# Patient Record
Sex: Female | Born: 1990 | Race: Black or African American | Hispanic: Yes | Marital: Single | Smoking: Never smoker
Health system: Southern US, Community
[De-identification: ages and names within clinical notes are randomized; demographics above are authoritative.]

## PROBLEM LIST (undated history)

## (undated) DIAGNOSIS — F419 Anxiety disorder, unspecified: Secondary | ICD-10-CM

## (undated) DIAGNOSIS — F32A Depression, unspecified: Secondary | ICD-10-CM

## (undated) DIAGNOSIS — G932 Benign intracranial hypertension: Secondary | ICD-10-CM

## (undated) DIAGNOSIS — E282 Polycystic ovarian syndrome: Secondary | ICD-10-CM

## (undated) HISTORY — DX: Anxiety disorder, unspecified: F41.9

## (undated) HISTORY — DX: Benign intracranial hypertension: G93.2

## (undated) HISTORY — DX: Depression, unspecified: F32.A

## (undated) HISTORY — PX: NO PAST SURGERIES: SHX2092

---

## 2020-04-09 ENCOUNTER — Emergency Department: Payer: 59

## 2020-04-09 ENCOUNTER — Encounter: Payer: Self-pay | Admitting: Emergency Medicine

## 2020-04-09 ENCOUNTER — Other Ambulatory Visit: Payer: Self-pay

## 2020-04-09 DIAGNOSIS — R110 Nausea: Secondary | ICD-10-CM | POA: Diagnosis present

## 2020-04-09 DIAGNOSIS — R519 Headache, unspecified: Secondary | ICD-10-CM | POA: Insufficient documentation

## 2020-04-09 DIAGNOSIS — Z5321 Procedure and treatment not carried out due to patient leaving prior to being seen by health care provider: Secondary | ICD-10-CM | POA: Diagnosis not present

## 2020-04-09 DIAGNOSIS — R0602 Shortness of breath: Secondary | ICD-10-CM | POA: Insufficient documentation

## 2020-04-09 LAB — COMPREHENSIVE METABOLIC PANEL
ALT: 30 U/L (ref 0–44)
AST: 32 U/L (ref 15–41)
Albumin: 3.9 g/dL (ref 3.5–5.0)
Alkaline Phosphatase: 98 U/L (ref 38–126)
Anion gap: 9 (ref 5–15)
BUN: 9 mg/dL (ref 6–20)
CO2: 22 mmol/L (ref 22–32)
Calcium: 8.5 mg/dL — ABNORMAL LOW (ref 8.9–10.3)
Chloride: 104 mmol/L (ref 98–111)
Creatinine, Ser: 0.92 mg/dL (ref 0.44–1.00)
GFR calc Af Amer: >60 mL/min (ref >60)
GFR calc non Af Amer: >60 mL/min (ref >60)
Glucose, Bld: 96 mg/dL (ref 70–99)
Potassium: 3.8 mmol/L (ref 3.5–5.1)
Sodium: 135 mmol/L (ref 135–145)
Total Bilirubin: 0.7 mg/dL (ref 0.3–1.2)
Total Protein: 7.6 g/dL (ref 6.5–8.1)

## 2020-04-09 LAB — CBC
HCT: 27.1 % — ABNORMAL LOW (ref 36.0–46.0)
Hemoglobin: 8.2 g/dL — ABNORMAL LOW (ref 12.0–15.0)
MCH: 25 pg — ABNORMAL LOW (ref 26.0–34.0)
MCHC: 30.3 g/dL (ref 30.0–36.0)
MCV: 82.6 fL (ref 80.0–100.0)
Platelets: 174 10*3/uL (ref 150–400)
RBC: 3.28 MIL/uL — ABNORMAL LOW (ref 3.87–5.11)
RDW: 14.7 % (ref 11.5–15.5)
WBC: 7.7 10*3/uL (ref 4.0–10.5)
nRBC: 0 % (ref 0.0–0.2)

## 2020-04-09 LAB — TROPONIN I (HIGH SENSITIVITY): Troponin I (High Sensitivity): <2 ng/L (ref <18)

## 2020-04-09 IMAGING — CR DG CHEST 2V
1 series · 2 of 2 positions shown · non-contrast
Comparison: None.

CLINICAL DATA: Shortness of breath

EXAM:
CHEST - 2 VIEW

[Series 1: dg chest 2 view · 0.14mm/px · 2 of 2 slices shown]
[im 1/2]
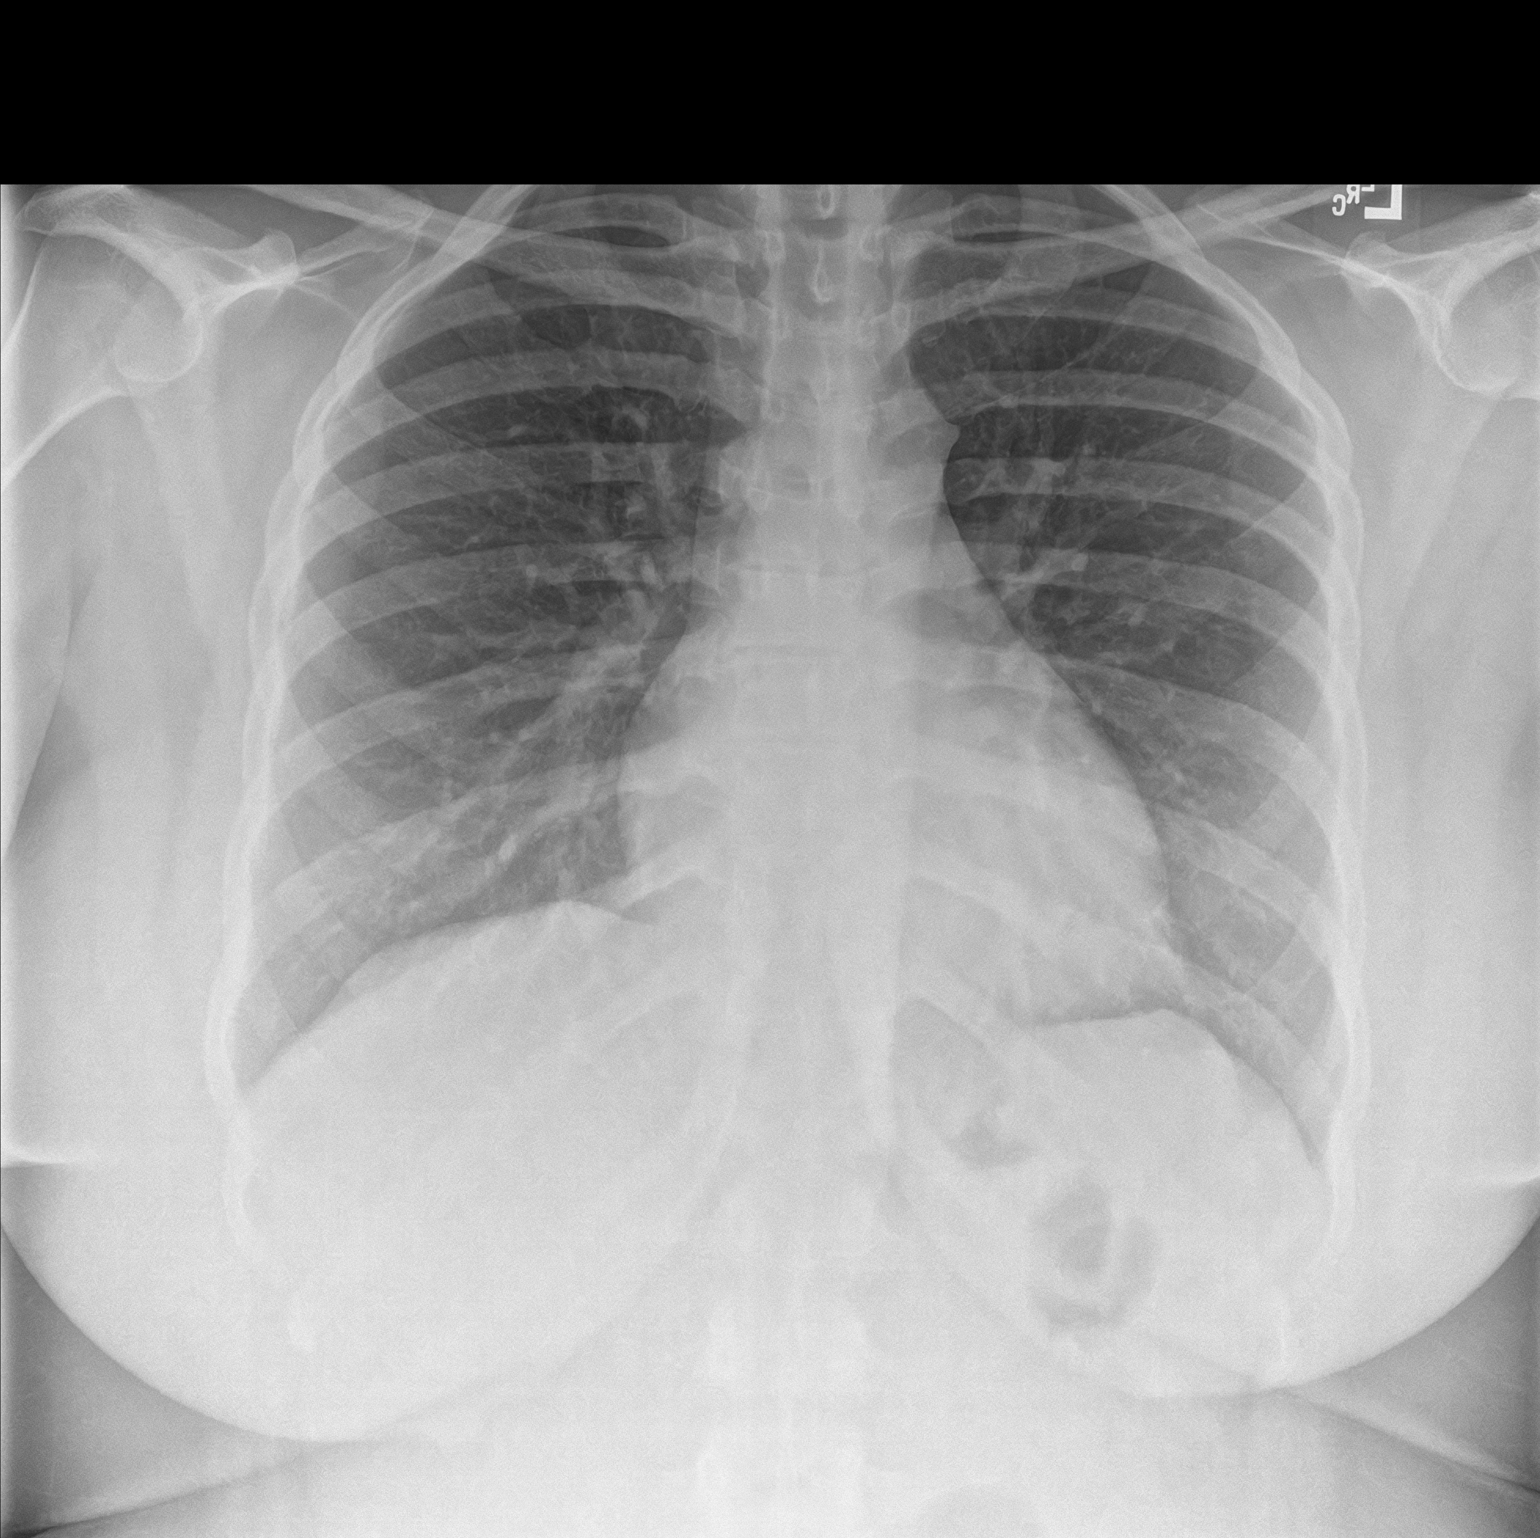
[im 2/2]
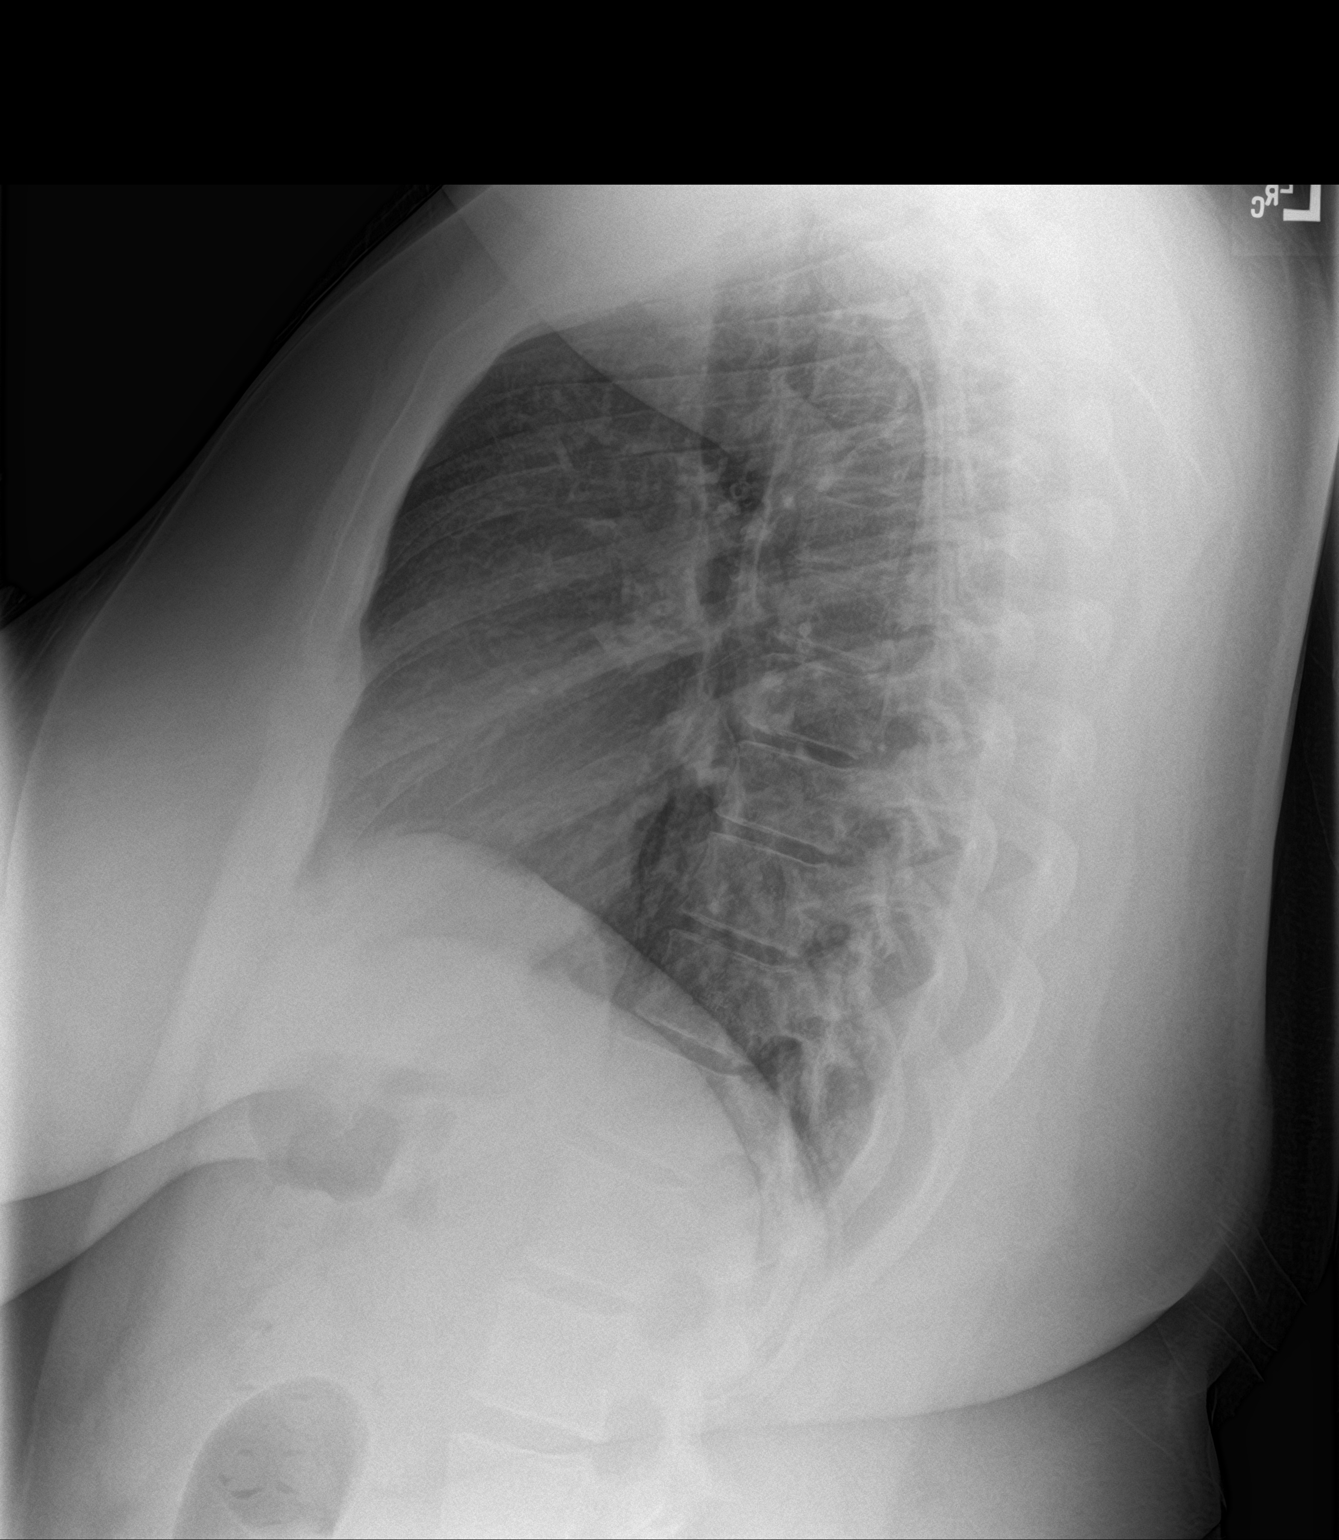

[2 of 2 positions shown; findings below may reference images not displayed]

FINDINGS: The heart size and mediastinal contours are within normal limits.
Both lungs are clear. The visualized skeletal structures are
unremarkable.
IMPRESSION: No active cardiopulmonary disease.

## 2020-04-09 NOTE — ED Triage Notes (Signed)
Patient with complaint of nausea, headaches and shortness of breath times one week.

## 2020-04-10 ENCOUNTER — Emergency Department
Admission: EM | Admit: 2020-04-10 | Discharge: 2020-04-10 | Disposition: A | Discharge status: Home / Self Care | Payer: 59 | Attending: Emergency Medicine | Admitting: Emergency Medicine

## 2020-04-10 LAB — TROPONIN I (HIGH SENSITIVITY): Troponin I (High Sensitivity): <2 ng/L (ref <18)

## 2020-04-11 ENCOUNTER — Telehealth: Payer: Self-pay | Admitting: Emergency Medicine

## 2020-04-11 NOTE — Telephone Encounter (Signed)
Called patient due to lwot to inquire about condition and follow up plans. I explained the hgb and hct values.  She says she does remember being anemic as a child.  I told her to call the Gyn she saw earlier this month and alert them that the results are in epic.  I told her for non gyn issues she can go to [facility] or [facility] urgent [facility] or return here.

## 2020-04-12 ENCOUNTER — Encounter: Payer: Self-pay | Admitting: Obstetrics & Gynecology

## 2020-10-11 ENCOUNTER — Other Ambulatory Visit: Payer: Self-pay

## 2020-10-11 DIAGNOSIS — M25512 Pain in left shoulder: Secondary | ICD-10-CM | POA: Diagnosis present

## 2020-10-11 DIAGNOSIS — R079 Chest pain, unspecified: Secondary | ICD-10-CM | POA: Diagnosis not present

## 2020-10-11 DIAGNOSIS — R002 Palpitations: Secondary | ICD-10-CM | POA: Diagnosis not present

## 2020-10-11 NOTE — ED Triage Notes (Signed)
Pt in with co palpitations for a few days and left upper arm pain. States also feels like there is an area to left upper arm that feels swollen. No cardiac history or history of the same.

## 2020-10-12 ENCOUNTER — Emergency Department
Admission: EM | Admit: 2020-10-12 | Discharge: 2020-10-12 | Disposition: A | Discharge status: Home / Self Care | Payer: 59 | Attending: Emergency Medicine | Admitting: Emergency Medicine

## 2020-10-12 DIAGNOSIS — R002 Palpitations: Secondary | ICD-10-CM

## 2020-10-12 DIAGNOSIS — M25512 Pain in left shoulder: Secondary | ICD-10-CM

## 2020-10-12 LAB — CBC
HCT: 34.1 % — ABNORMAL LOW (ref 36.0–46.0)
Hemoglobin: 10.2 g/dL — ABNORMAL LOW (ref 12.0–15.0)
MCH: 22.2 pg — ABNORMAL LOW (ref 26.0–34.0)
MCHC: 29.9 g/dL — ABNORMAL LOW (ref 30.0–36.0)
MCV: 74.3 fL — ABNORMAL LOW (ref 80.0–100.0)
Platelets: 179 10*3/uL (ref 150–400)
RBC: 4.59 MIL/uL (ref 3.87–5.11)
RDW: 18.7 % — ABNORMAL HIGH (ref 11.5–15.5)
WBC: 6.6 10*3/uL (ref 4.0–10.5)
nRBC: 0 % (ref 0.0–0.2)

## 2020-10-12 LAB — COMPREHENSIVE METABOLIC PANEL
ALT: 27 U/L (ref 0–44)
AST: 26 U/L (ref 15–41)
Albumin: 3.9 g/dL (ref 3.5–5.0)
Alkaline Phosphatase: 102 U/L (ref 38–126)
Anion gap: 10 (ref 5–15)
BUN: 13 mg/dL (ref 6–20)
CO2: 24 mmol/L (ref 22–32)
Calcium: 8.7 mg/dL — ABNORMAL LOW (ref 8.9–10.3)
Chloride: 102 mmol/L (ref 98–111)
Creatinine, Ser: 0.8 mg/dL (ref 0.44–1.00)
GFR, Estimated: >60 mL/min (ref >60)
Glucose, Bld: 106 mg/dL — ABNORMAL HIGH (ref 70–99)
Potassium: 3.7 mmol/L (ref 3.5–5.1)
Sodium: 136 mmol/L (ref 135–145)
Total Bilirubin: 0.7 mg/dL (ref 0.3–1.2)
Total Protein: 7.8 g/dL (ref 6.5–8.1)

## 2020-10-12 LAB — TROPONIN I (HIGH SENSITIVITY): Troponin I (High Sensitivity): <2 ng/L (ref <18)

## 2020-10-12 NOTE — Discharge Instructions (Addendum)
Your cardiac work-up today was very reassuring.  No sign of heart attack.  Symptoms may be related to anxiety.  Steps to find a Primary Care Provider (PCP):  Call [phone] or [phone] to access "[facility] Find a Doctor Service."  2.  You may also go on the [facility] website at [email or web address]

## 2020-10-12 NOTE — ED Provider Notes (Signed)
[facility] Emergency Department Provider Note  ____________________________________________   Event Date/Time   First MD Initiated Contact with Patient 10/12/20 0133     (approximate)  I have reviewed the triage vital signs and the nursing notes.   HISTORY  Chief Complaint Chest Pain    HPI Anna Douglas is a 30 y.o. female with history of anxiety who presents to the emergency department with left shoulder achiness for the past week.  Also has had intermittent palpitations.  No chest pain, shortness of breath, nausea, vomiting, diaphoresis, dizziness, fever, cough.  No history of PE, DVT, exogenous estrogen use, recent fractures, surgery, trauma, hospitalization, prolonged travel or other immobilization. No lower extremity swelling or pain. No calf tenderness.  States she was telling her grandmother about her symptoms tonight and she told her she needs to come to the emergency department for evaluation.  No injury to the arm.        No past medical history on file.  There are no problems to display for this patient.   No past surgical history on file.  Prior to Admission medications   Not on File    Allergies Patient has no known allergies.  No family history on file.  Social History Social History   Tobacco Use  . Smoking status: Never Smoker  . Smokeless tobacco: Never Used  Substance Use Topics  . Alcohol use: Yes    Comment: occ  . Drug use: Never    Review of Systems Constitutional: No fever. Eyes: No visual changes. ENT: No sore throat. Cardiovascular: Denies chest pain. Respiratory: Denies shortness of breath. Gastrointestinal: No nausea, vomiting, diarrhea. Genitourinary: Negative for dysuria. Musculoskeletal: Negative for back pain. Skin: Negative for rash. Neurological: Negative for focal weakness or numbness.  ____________________________________________   PHYSICAL EXAM:  VITAL SIGNS: ED Triage Vitals   Enc Vitals Group     BP 10/12/20 0242 115/64     Pulse Rate 10/11/20 2331 89     Resp 10/11/20 2331 20     Temp 10/11/20 2331 98.4 F (36.9 C)     Temp Source 10/11/20 2331 Oral     SpO2 10/11/20 2331 100 %     Weight 10/11/20 2331 270 lb (122.5 kg)     Height 10/11/20 2331 5\' 10"  (1.778 m)     Head Circumference --      Peak Flow --      Pain Score 10/11/20 2331 5     Pain Loc --      Pain Edu? --      Excl. in GC? --    CONSTITUTIONAL: Alert and oriented and responds appropriately to questions. Well-appearing; well-nourished HEAD: Normocephalic EYES: Conjunctivae clear, pupils appear equal, EOM appear intact ENT: normal nose; moist mucous membranes NECK: Supple, normal ROM CARD: RRR; S1 and S2 appreciated; no murmurs, no clicks, no rubs, no gallops RESP: Normal chest excursion without splinting or tachypnea; breath sounds clear and equal bilaterally; no wheezes, no rhonchi, no rales, no hypoxia or respiratory distress, speaking full sentences ABD/GI: Normal bowel sounds; non-distended; soft, non-tender, no rebound, no guarding, no peritoneal signs, no hepatosplenomegaly BACK: The back appears normal EXT: Normal ROM in all joints; no deformity noted, no edema; no cyanosis, no swelling to the left upper extremity, no joint effusions, no bony deformity, 2+ left radial pulse, normal sensation throughout the left arm, no redness or warmth in the left upper extremity, compartments in the left arm are soft, no calf tenderness  or calf swelling SKIN: Normal color for age and race; warm; no rash on exposed skin NEURO: Moves all extremities equally PSYCH: The patient's mood and manner are appropriate.  ____________________________________________   LABS (all labs ordered are listed, but only abnormal results are displayed)  Labs Reviewed  CBC - Abnormal; Notable for the following components:      Result Value   Hemoglobin 10.2 (*)    HCT 34.1 (*)    MCV 74.3 (*)    MCH 22.2 (*)     MCHC 29.9 (*)    RDW 18.7 (*)    All other components within normal limits  COMPREHENSIVE METABOLIC PANEL - Abnormal; Notable for the following components:   Glucose, Bld 106 (*)    Calcium 8.7 (*)    All other components within normal limits  POC URINE PREG, ED  TROPONIN I (HIGH SENSITIVITY)   ____________________________________________  EKG   EKG Interpretation  Date/Time:  Wednesday October 11 2020 23:28:08 EST Ventricular Rate:  93 PR Interval:  144 QRS Duration: 94 QT Interval:  352 QTC Calculation: 437 R Axis:   68 Text Interpretation: Normal sinus rhythm with sinus arrhythmia Incomplete right bundle branch block Borderline ECG No significant change since last tracing Confirmed by Rochele Raring [phone]) on 10/12/2020 2:09:04 AM       ____________________________________________  RADIOLOGY Normajean Baxter Lucyle Alumbaugh, personally viewed and evaluated these images (plain radiographs) as part of my medical decision making, as well as reviewing the written report by the radiologist.  ED MD interpretation:  none  Official radiology report(s): No results found.  ____________________________________________   PROCEDURES  Procedure(s) performed (including Critical Care):  none  ____________________________________________   INITIAL IMPRESSION / ASSESSMENT AND PLAN / ED COURSE  As part of my medical decision making, I reviewed the following data within the electronic MEDICAL RECORD NUMBER Nursing notes reviewed and incorporated, Labs reviewed, EKG interpreted NSR and Notes from prior ED visits         Patient here with left shoulder pain.  She was concerned that this could be cardiac in nature.  No risk factors for ACS other than obesity.  She is PERC negative.  She denies any chest pain or shortness of breath.  No infectious symptoms.  Labs here are reassuring.  Troponin negative and this is after 1 week of symptoms.  EKG nonischemic.  I feel she is safe for discharge.  She  thinks that her symptoms may be related to anxiety.  Will give outpatient PCP follow-up.  Doubt ACS, PE, dissection today.  Doubt pneumonia.  No signs of volume overload.   At this time, I do not feel there is any life-threatening condition present. I have reviewed, interpreted and discussed all results (EKG, imaging, lab, urine as appropriate) and exam findings with patient/family. I have reviewed nursing notes and appropriate previous records.  I feel the patient is safe to be discharged home without further emergent workup and can continue workup as an outpatient as needed. Discussed usual and customary return precautions. Patient/family verbalize understanding and are comfortable with this plan.  Outpatient follow-up has been provided as needed. All questions have been answered.       ____________________________________________   FINAL CLINICAL IMPRESSION(S) / ED DIAGNOSES  Final diagnoses:  Acute pain of left shoulder  Palpitations     ED Discharge Orders    None      *Please note:  Dameka Alyss Granato was evaluated in Emergency Department on 10/12/2020 for the symptoms described  in the history of present illness. She was evaluated in the context of the global COVID-19 pandemic, which necessitated consideration that the patient might be at risk for infection with the SARS-CoV-2 virus that causes COVID-19. Institutional protocols and algorithms that pertain to the evaluation of patients at risk for COVID-19 are in a state of rapid change based on information released by regulatory bodies including the CDC and federal and state organizations. These policies and algorithms were followed during the patient's care in the ED.  Some ED evaluations and interventions may be delayed as a result of limited staffing during and the pandemic.*   Note:  This document was prepared using Dragon voice recognition software and may include unintentional dictation errors.   Makynlee Kressin, Layla Maw,  DO 10/12/20 [phone]

## 2021-02-28 ENCOUNTER — Other Ambulatory Visit: Payer: Self-pay

## 2021-02-28 ENCOUNTER — Emergency Department
Admission: EM | Admit: 2021-02-28 | Discharge: 2021-02-28 | Disposition: A | Discharge status: Home / Self Care | Payer: 59 | Attending: Emergency Medicine | Admitting: Emergency Medicine

## 2021-02-28 ENCOUNTER — Emergency Department: Payer: 59

## 2021-02-28 ENCOUNTER — Encounter: Payer: Self-pay | Admitting: Emergency Medicine

## 2021-02-28 DIAGNOSIS — R519 Headache, unspecified: Secondary | ICD-10-CM | POA: Diagnosis present

## 2021-02-28 DIAGNOSIS — G932 Benign intracranial hypertension: Secondary | ICD-10-CM | POA: Diagnosis not present

## 2021-02-28 HISTORY — DX: Polycystic ovarian syndrome: E28.2

## 2021-02-28 LAB — CBC WITH DIFFERENTIAL/PLATELET
Abs Immature Granulocytes: 0.01 10*3/uL (ref 0.00–0.07)
Basophils Absolute: 0 10*3/uL (ref 0.0–0.1)
Basophils Relative: 0 %
Eosinophils Absolute: 0 10*3/uL (ref 0.0–0.5)
Eosinophils Relative: 0 %
HCT: 36.3 % (ref 36.0–46.0)
Hemoglobin: 11.3 g/dL — ABNORMAL LOW (ref 12.0–15.0)
Immature Granulocytes: 0 %
Lymphocytes Relative: 30 %
Lymphs Abs: 1.6 10*3/uL (ref 0.7–4.0)
MCH: 24.4 pg — ABNORMAL LOW (ref 26.0–34.0)
MCHC: 31.1 g/dL (ref 30.0–36.0)
MCV: 78.4 fL — ABNORMAL LOW (ref 80.0–100.0)
Monocytes Absolute: 0.2 10*3/uL (ref 0.1–1.0)
Monocytes Relative: 5 %
Neutro Abs: 3.5 10*3/uL (ref 1.7–7.7)
Neutrophils Relative %: 65 %
Platelets: 176 10*3/uL (ref 150–400)
RBC: 4.63 MIL/uL (ref 3.87–5.11)
RDW: 15.7 % — ABNORMAL HIGH (ref 11.5–15.5)
WBC: 5.4 10*3/uL (ref 4.0–10.5)
nRBC: 0 % (ref 0.0–0.2)

## 2021-02-28 LAB — BASIC METABOLIC PANEL
Anion gap: 7 (ref 5–15)
BUN: 12 mg/dL (ref 6–20)
CO2: 24 mmol/L (ref 22–32)
Calcium: 9.2 mg/dL (ref 8.9–10.3)
Chloride: 104 mmol/L (ref 98–111)
Creatinine, Ser: 0.8 mg/dL (ref 0.44–1.00)
GFR, Estimated: >60 mL/min (ref >60)
Glucose, Bld: 90 mg/dL (ref 70–99)
Potassium: 3.9 mmol/L (ref 3.5–5.1)
Sodium: 135 mmol/L (ref 135–145)

## 2021-02-28 LAB — HCG, QUANTITATIVE, PREGNANCY: hCG, Beta Chain, Quant, S: <1 m[IU]/mL (ref <5)

## 2021-02-28 IMAGING — MR MR MRV HEAD WO/W CM
2 of 5 series · 36 of 48 positions shown · IV contrast (10ml Gadavist)
Comparison: No pertinent prior exam.

CLINICAL DATA: Vision loss.

EXAM:
MR VENOGRAM HEAD WITHOUT AND WITH CONTRAST
TECHNIQUE: Angiographic images of the intracranial venous structures were
acquired using MRV technique without and with intravenous contrast.
CONTRAST:  10mL GADAVIST GADOBUTROL 1 MMOL/ML IV SOLN

[Series 6: TOF · coronal · 2.5mm · 0.98mm/px · 14 of 128 slices shown]
[im 1/128]
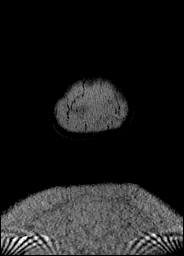
[im 10/128]
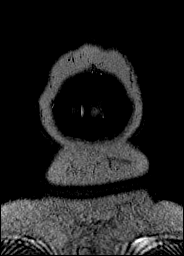
[im 20/128]
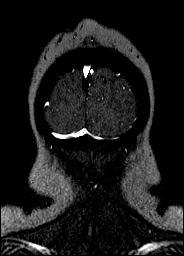
[im 30/128]
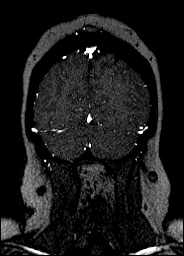
[im 40/128]
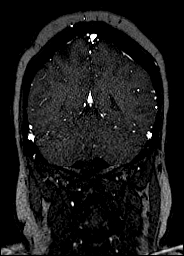
[im 49/128]
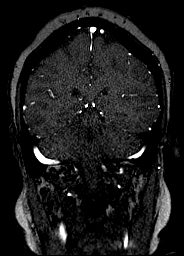
[im 59/128]
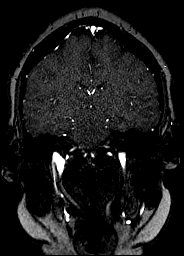
[im 69/128]
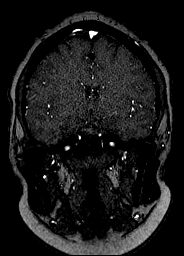
[im 79/128]
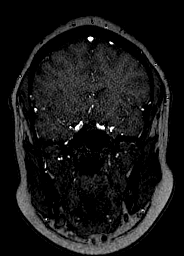
[im 88/128]
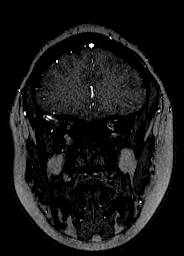
[im 98/128]
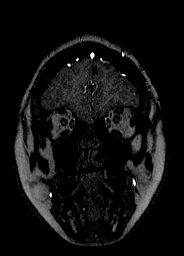
[im 108/128]
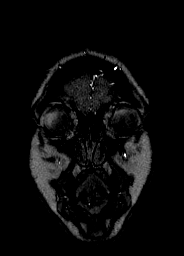
[im 118/128]
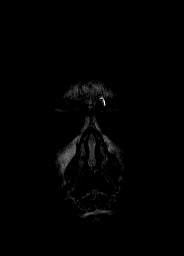
[im 128/128]
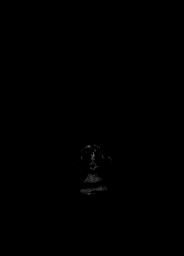

[Series 13: T1 post-contrast · sagittal · 1.0mm · 0.98mm/px · 22 of 192 slices shown]
[im 1/192]
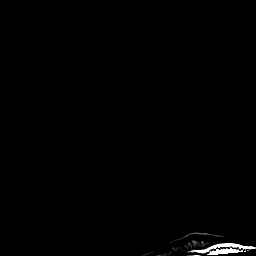
[im 10/192]
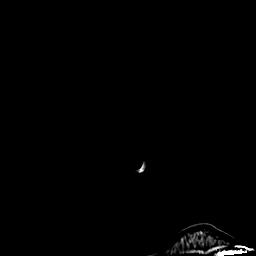
[im 19/192]
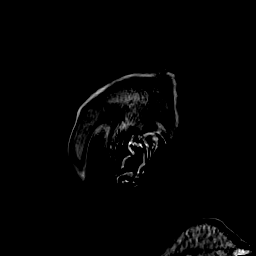
[im 28/192]
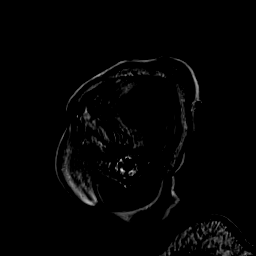
[im 37/192]
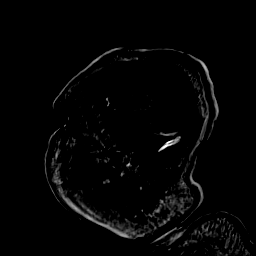
[im 46/192]
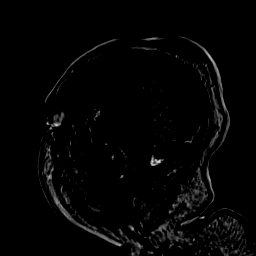
[im 55/192]
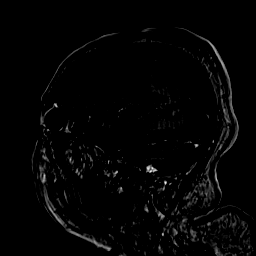
[im 64/192]
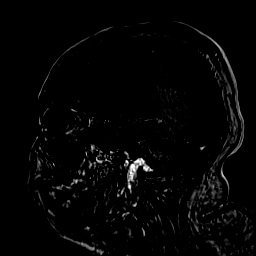
[im 73/192]
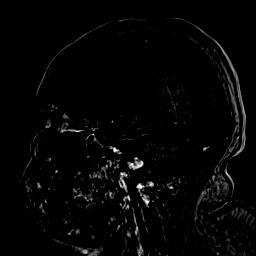
[im 82/192]
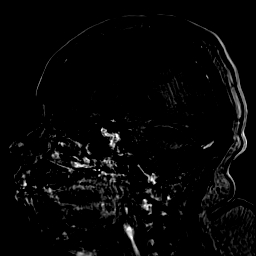
[im 91/192]
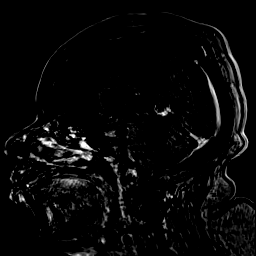
[im 101/192]
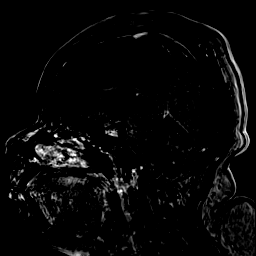
[im 110/192]
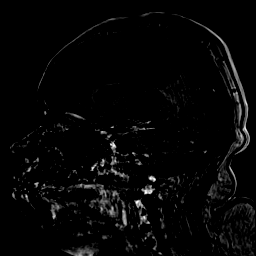
[im 119/192]
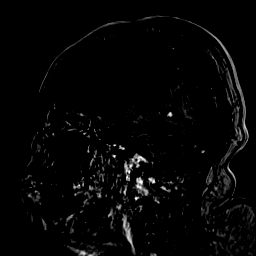
[im 128/192]
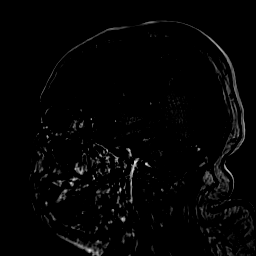
[im 137/192]
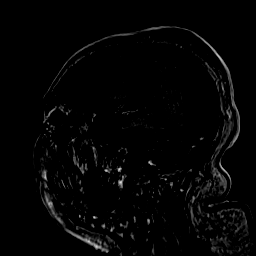
[im 146/192]
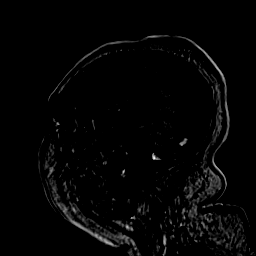
[im 155/192]
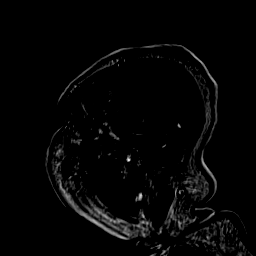
[im 164/192]
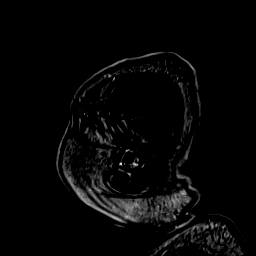
[im 173/192]
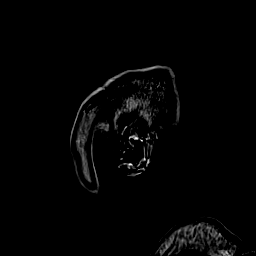
[im 182/192]
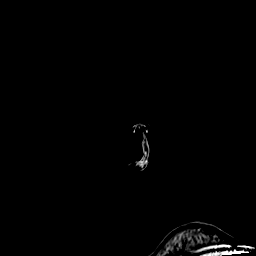
[im 192/192]
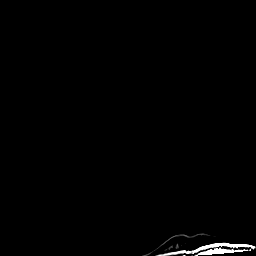

[36 of 48 positions shown; findings below may reference images not displayed]

FINDINGS: Postcontrast imaging is limited by motion. No evidence of dural
venous sinus thrombosis on the time-of-flight MRV. Small left
transverse sinus. Narrowing of the distal right transverse sinus.
Small rounded filling defects in the superior sagittal sinus and
distal right transverse sinus are favored to represent arachnoid
granulations. Linear filling apparent filling defect at the right
jugular bulb is favored to represent flow artifact.
IMPRESSION: 1. No evidence of dural venous sinus thrombosis with postcontrast
imaging limited by motion.
2. Small left transverse sinus and narrowing of the distal right
transverse sinus, which is nonspecific but can be seen with
idiopathic intracranial hypertension (particularly given findings on
same day CT head and MRI orbits).

## 2021-02-28 IMAGING — MR MR ORBITS WO/W CM
4 of 6 series · 25 of 48 positions shown · IV contrast (10ml Gadavist)
Comparison: Same day CT head.

CLINICAL DATA: Vision changes

EXAM:
MRI OF THE ORBITS WITHOUT AND WITH CONTRAST
TECHNIQUE: Multiplanar, multi-echo pulse sequences of the orbits and
surrounding structures were acquired including fat saturation
techniques, before and after intravenous contrast administration.
CONTRAST:  10mL GADAVIST GADOBUTROL 1 MMOL/ML IV SOLN

[Series 5: T1 · sagittal · 3.0mm · 0.38mm/px · 8 of 33 slices shown (1 of 2)]
[im 1/33]
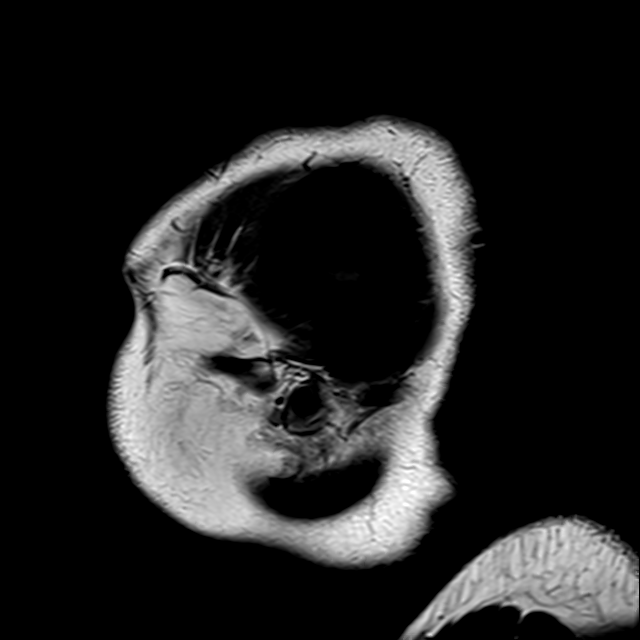
[im 4/33]
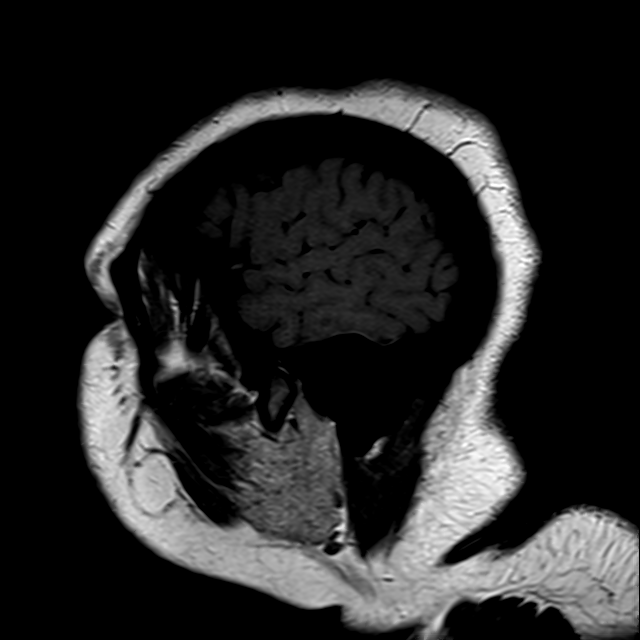
[im 11/33]
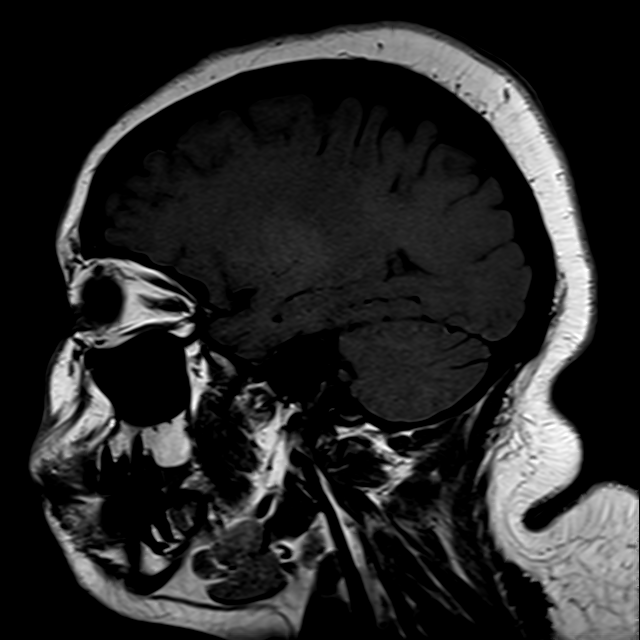
[im 15/33]
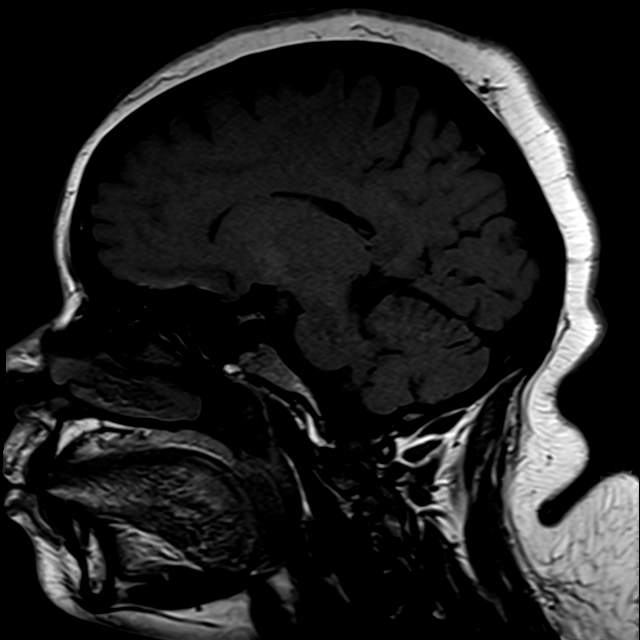
[im 18/33]
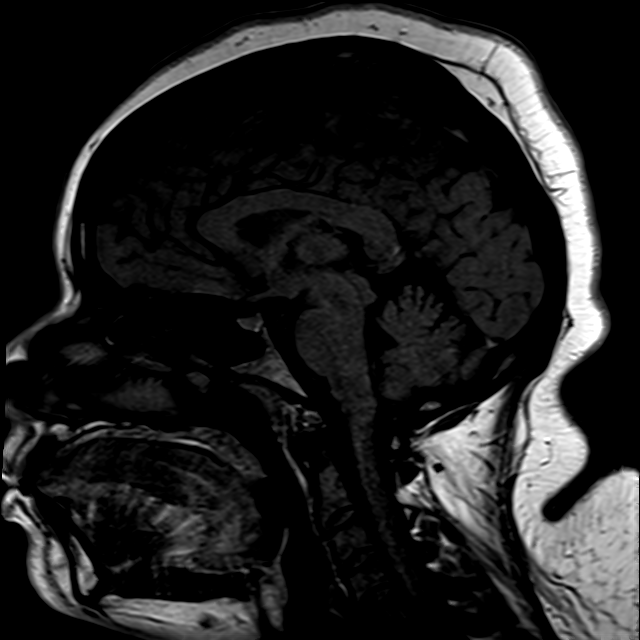
[im 22/33]
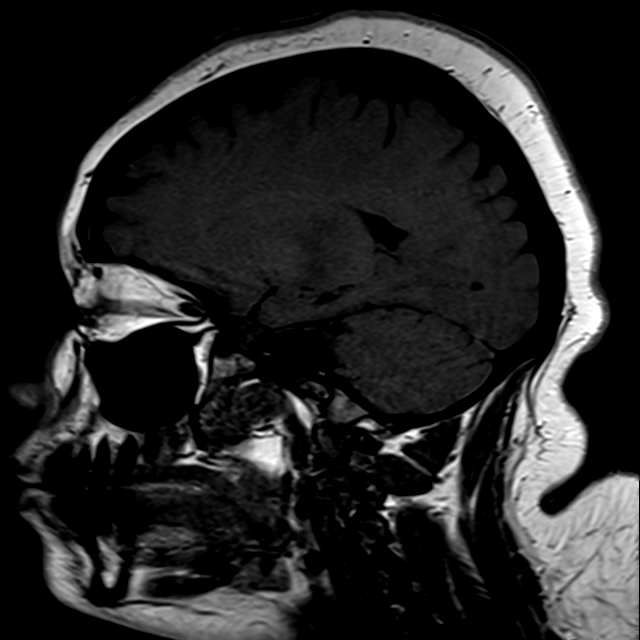
[im 29/33]
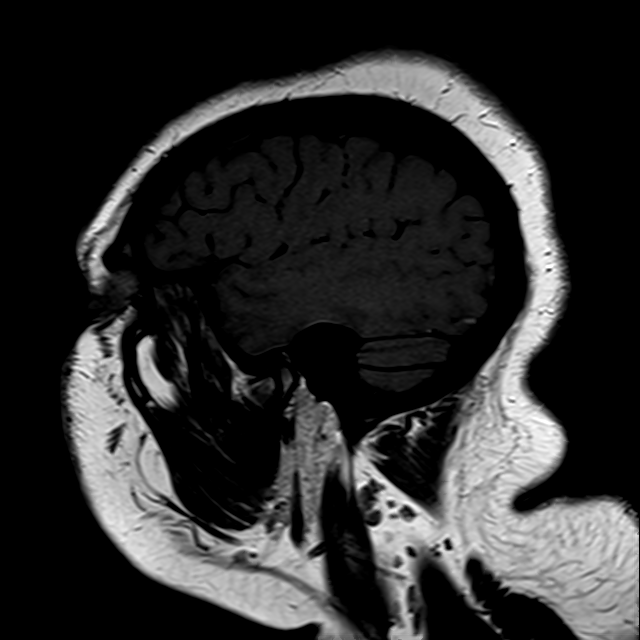
[im 33/33]
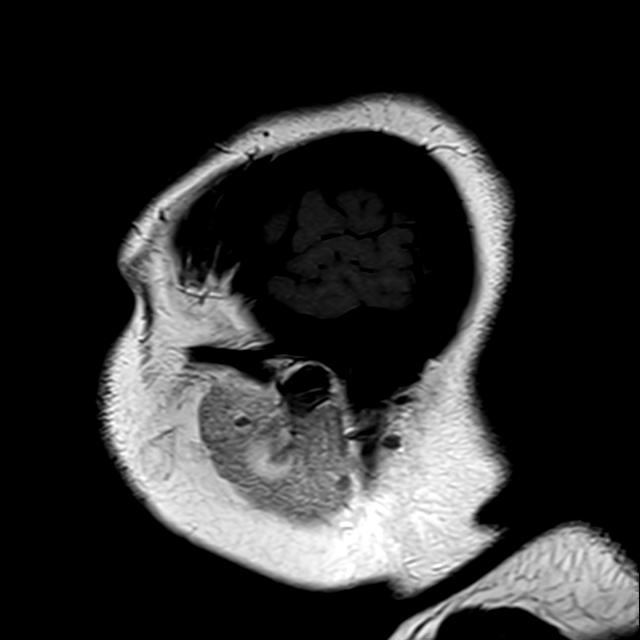

[Series 7: T2 · axial · 3.0mm · 0.78mm/px · z∈[-54,+10]mm · 5 of 17 slices shown (1 of 2)]
[im 1/17]
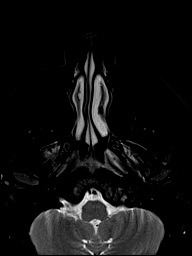
[im 5/17]
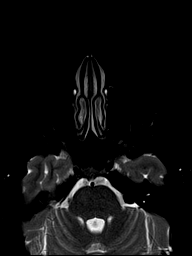
[im 9/17]
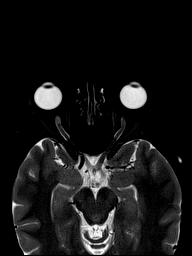
[im 13/17]
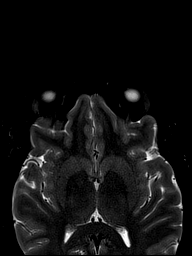
[im 17/17]
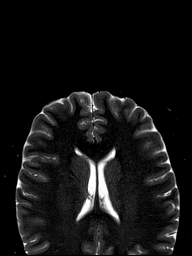

[Series 9: T2 · coronal · 3.0mm · 0.78mm/px · 8 of 35 slices shown (2 of 2)]
[im 1/35]
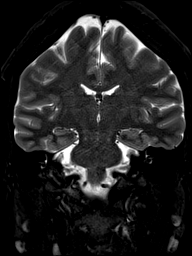
[im 4/35]
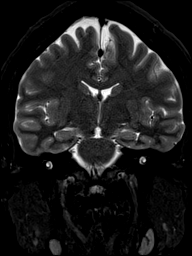
[im 12/35]
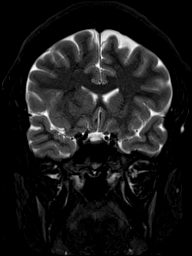
[im 16/35]
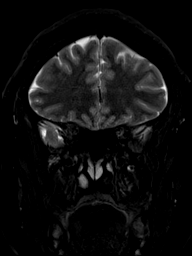
[im 19/35]
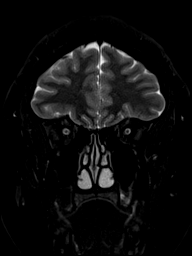
[im 23/35]
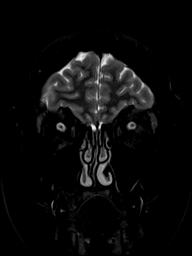
[im 31/35]
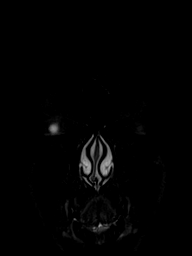
[im 35/35]
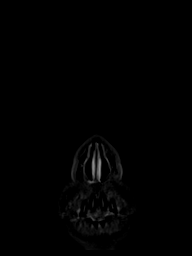

[Series 10: T1 · axial · non-contrast · 3.0mm · 0.31mm/px · z∈[-57,+9]mm · 4 of 23 slices shown (2 of 2)]
[im 1/23]
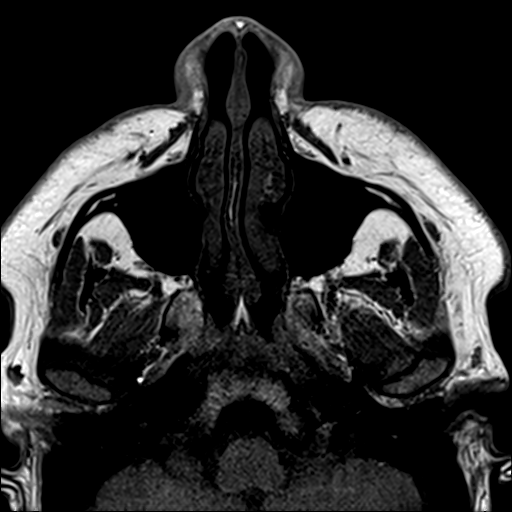
[im 5/23]
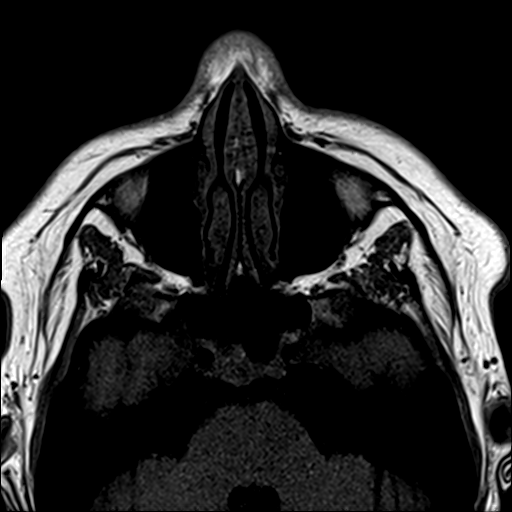
[im 14/23]
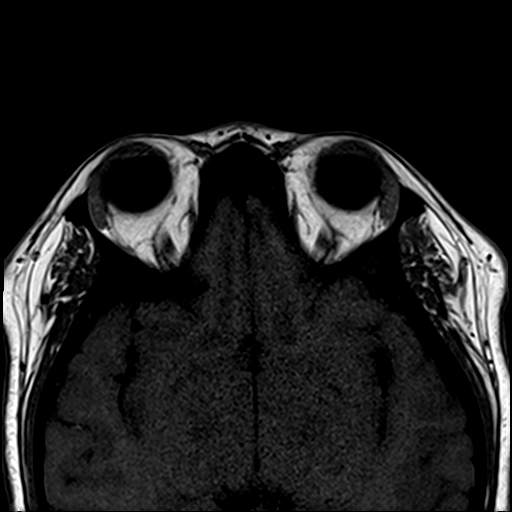
[im 23/23]
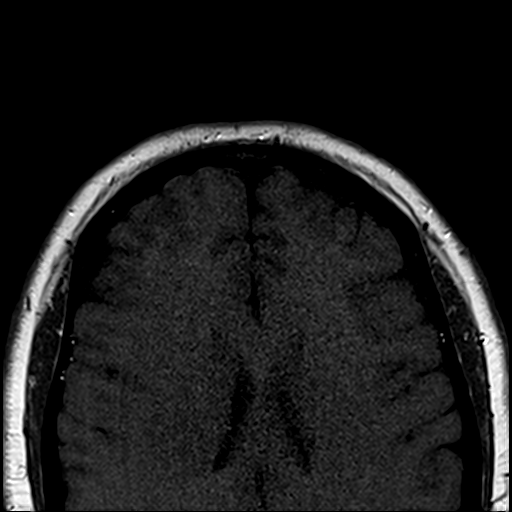

[25 of 48 positions shown; findings below may reference images not displayed]

FINDINGS: Orbits: Mildly tortuosity of the optic nerves with mild prominence
of the optic nerve sheaths bilaterally. No clear flattening of the
posterior sclera or protrusion of the optic nerve heads to suggest
papilledema. No orbital mass or evidence of inflammation. Normal
appearance of the globes, extraocular muscles, orbital fat and
lacrimal glands. Motion limited postcontrast imaging without obvious
evidence of abnormal enhancement.

Visualized sinuses: Clear sinuses.

Soft tissues: Normal.

Limited intracranial: No acute or significant finding.
IMPRESSION: Mildly tortuosity of the optic nerves bilaterally with mild
prominence of the optic nerve sheaths, which is nonspecific but can
be seen with increased intracranial pressure (including idiopathic
intracranial hypertension). No clear flattening of the posterior
sclera or protrusion of the optic nerve heads to suggest papilledema
although funduscopic exam could better evaluate if clinically
indicated.

## 2021-02-28 IMAGING — CT CT HEAD W/O CM
3 series · 15 of 47 positions shown, 18 images · non-contrast
Comparison: None.

CLINICAL DATA: 29-year-old female with frontal headache for 2
months.

EXAM:
CT HEAD WITHOUT CONTRAST
TECHNIQUE: Contiguous axial images were obtained from the base of the skull
through the vertex without intravenous contrast.

[Series 2: head wo · axial · 0.44mm/px · z∈[-138,-3]mm · 9 of 33 slices shown, 12 images]
[im 3/33  brain]
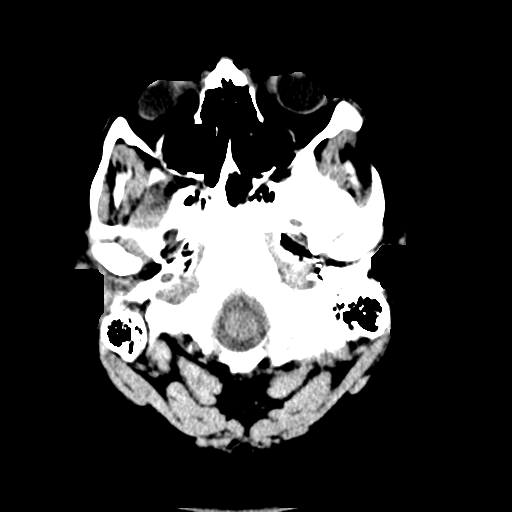
[im 3/33  bone]
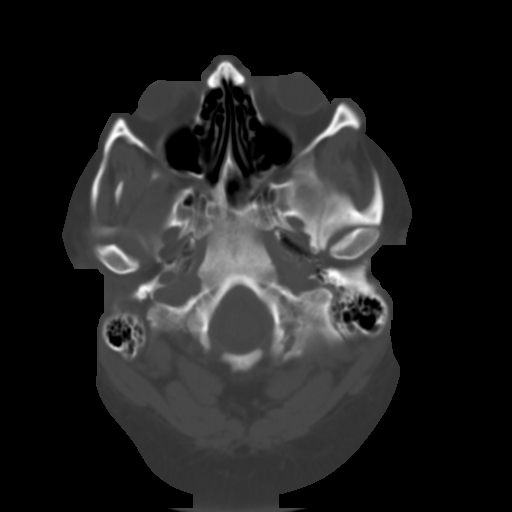
[im 6/33  brain]
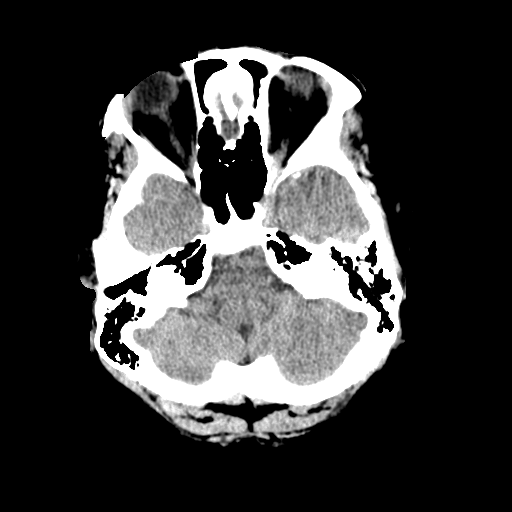
[im 9/33  brain]
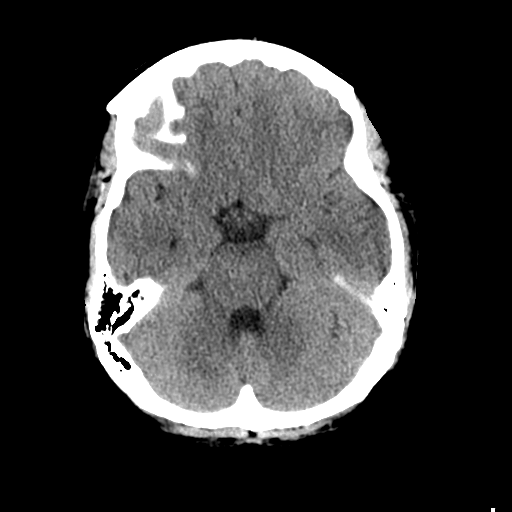
[im 13/33  brain]
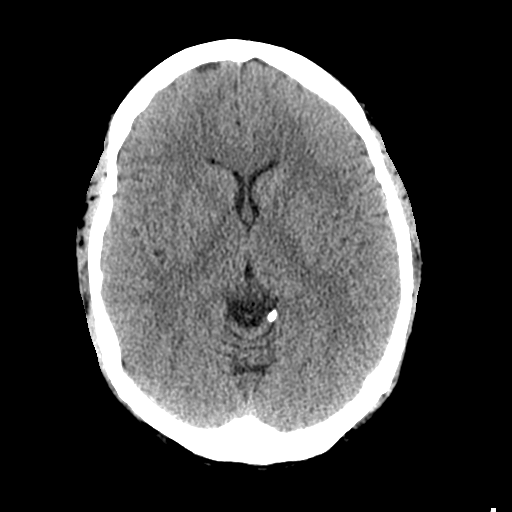
[im 17/33  brain]
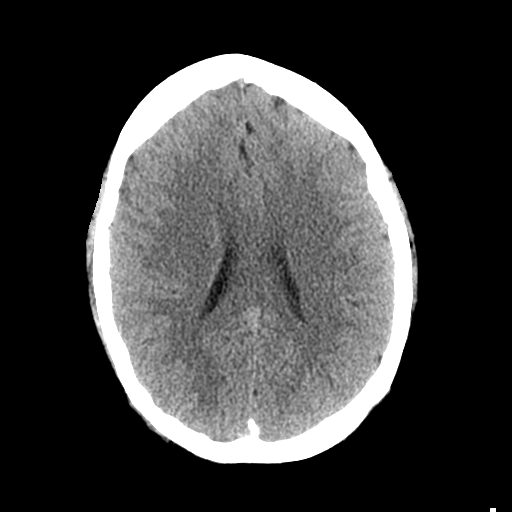
[im 17/33  bone]
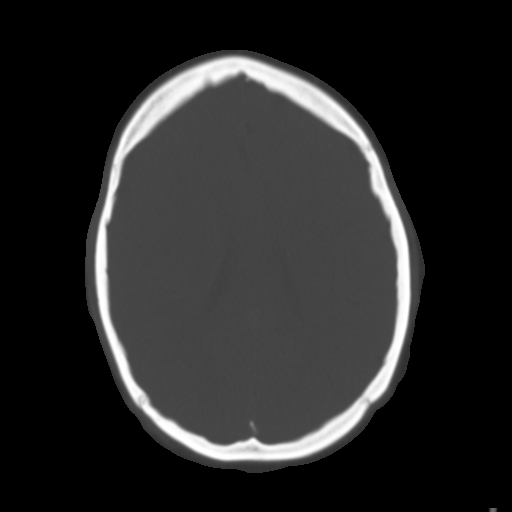
[im 20/33  brain]
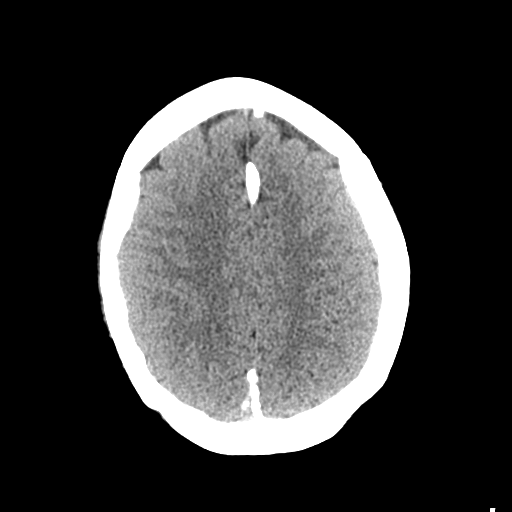
[im 24/33  brain]
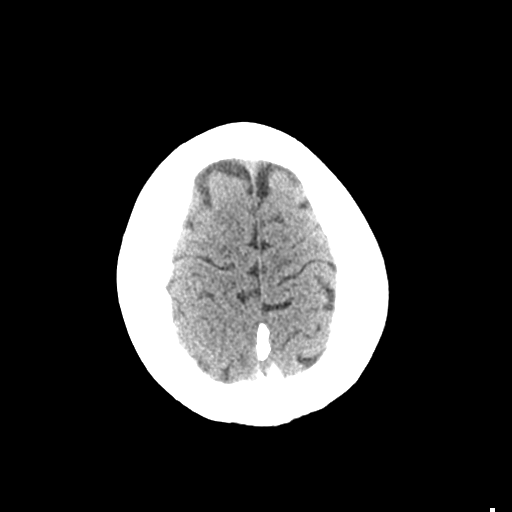
[im 27/33  brain]
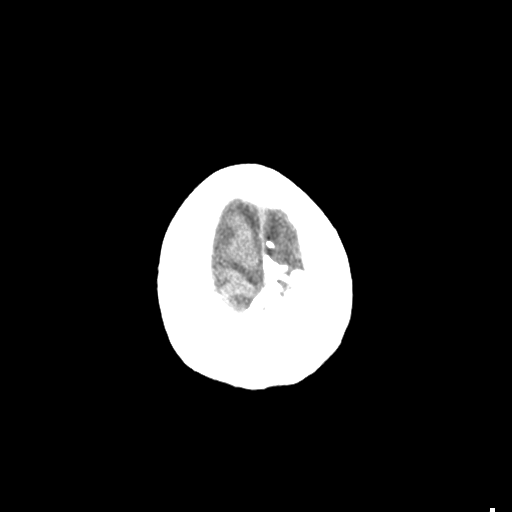
[im 30/33  brain]
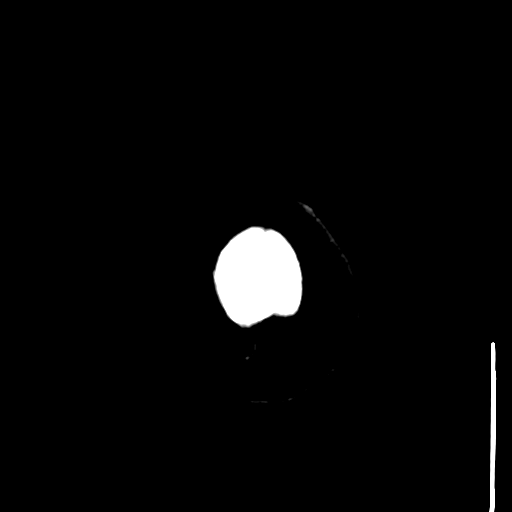
[im 30/33  bone]
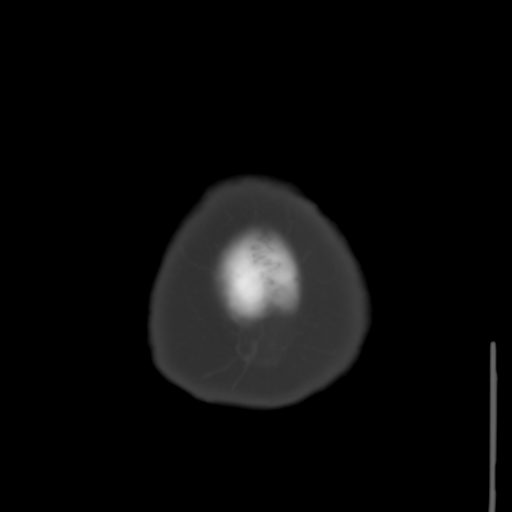

[Series 4: coronal soft tissue · coronal · 0.34mm/px · 3 of 69 slices shown]
[im 23/69  brain]
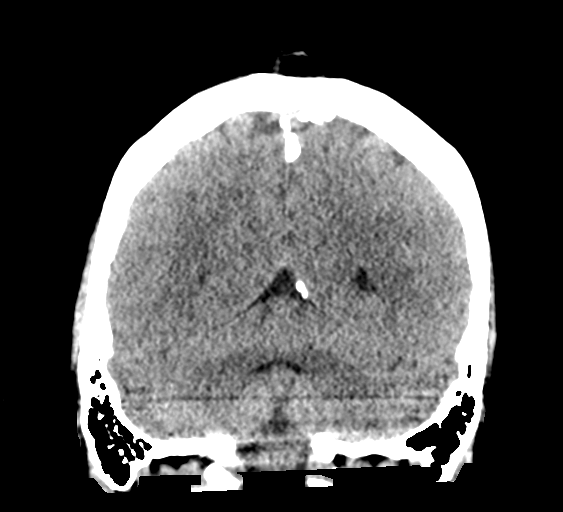
[im 31/69  brain]
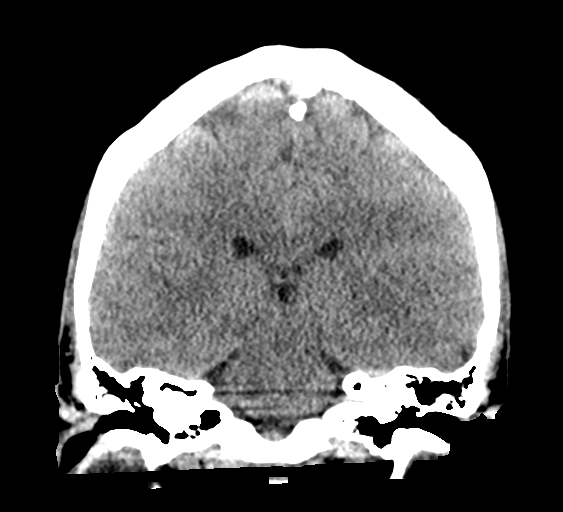
[im 38/69  brain]
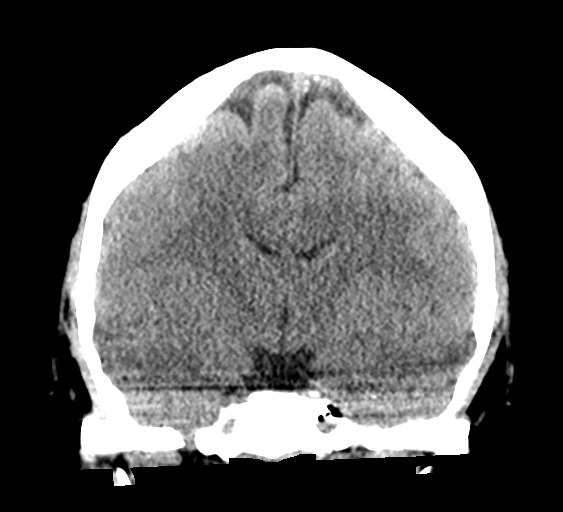

[Series 5: sagittal soft tissue · sagittal · 0.32mm/px · 3 of 56 slices shown]
[im 19/56  brain]
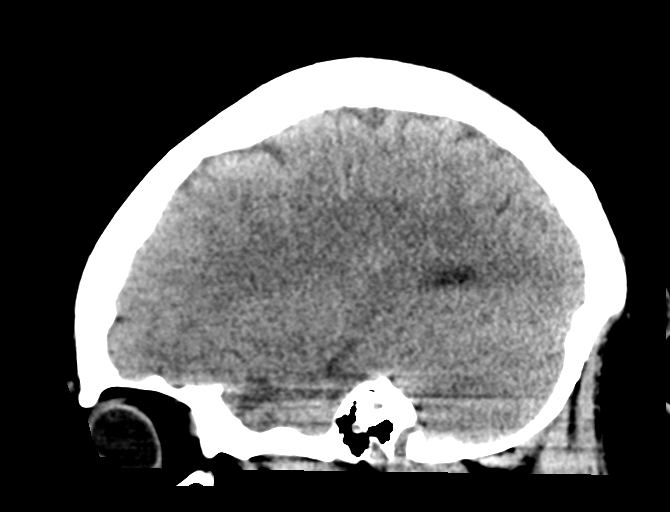
[im 28/56  brain]
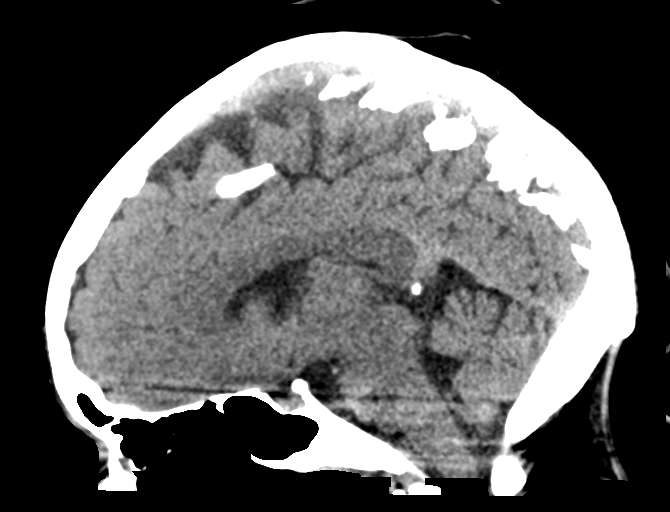
[im 37/56  brain]
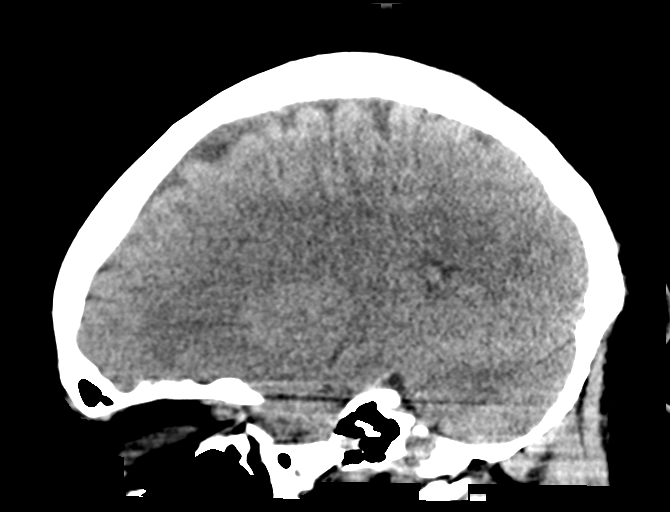

[15 of 47 positions shown; findings below may reference images not displayed]

FINDINGS: Brain: Incidental dural calcifications, especially along the falx.
No midline shift, ventriculomegaly, mass effect, evidence of mass
lesion, intracranial hemorrhage or evidence of cortically based
acute infarction. Gray-white matter differentiation is within normal
limits throughout the brain. No encephalomalacia identified.
Suggestion of partially empty sella on sagittal image 26.

Vascular: No suspicious intracranial vascular hyperdensity.

Skull: Negative.

Sinuses/Orbits: Visualized paranasal sinuses and mastoids are clear.

Other: Visualized orbits and scalp soft tissues are within normal
limits.
IMPRESSION: Normal noncontrast Head CT aside from partially empty sella, which
is often a normal anatomic variant but can be associated with
idiopathic intracranial hypertension (pseudotumor cerebri).

## 2021-02-28 MED ORDER — GADOBUTROL 1 MMOL/ML IV SOLN
10.0000 mL | Freq: Once | INTRAVENOUS | Status: AC | PRN
  Administered 2021-02-28: 10 mL via INTRAVENOUS
  Filled 2021-02-28: qty 10

## 2021-02-28 NOTE — ED Provider Notes (Signed)
[facility] Emergency Department Provider Note  ____________________________________________   Event Date/Time   First MD Initiated Contact with Patient 02/28/21 1100     (approximate)  I have reviewed the triage vital signs and the nursing notes.   HISTORY  Chief Complaint No chief complaint on file.   HPI Arushi Partridge is a 30 y.o. female with past medical history of anxiety and with some chronic upper back pain from an injury 2 years ago who presents for assessment of frontal headache that she states been on and off for the last 2 months.  She states she has not yet establish PCP care she recently failure ago but has an appointment and has not been evaluated for this.  States he was seen by an eye doctor last month that she has had some intermittent floaters in her vision with all she does not exactly what her eye doctor told her.  She is not currently wearing glasses or contacts and does not take any medications for this.  She states she will sometimes have a little pressure shoulders but this been ongoing for several years and has not been evaluated today.  No new vision changes, sore throat, chest pain, earache, vertigo, posterior headache, neck pain, chest pain, cough, shortness of breath, nausea, vomiting, diarrhea, dysuria, rash or recent falls or injuries.  Patient denies EtOH use, and Starkes tobacco abuse.  She states she has bad anxiety and is not currently treated but think she needs to see a therapist.  She denies any SI or HI.         Past Medical History:  Diagnosis Date  . Polycystic ovarian disease     There are no problems to display for this patient.   History reviewed. No pertinent surgical history.  Prior to Admission medications   Not on File    Allergies Patient has no known allergies.  No family history on file.  Social History Social History   Tobacco Use  . Smoking status: Never  . Smokeless tobacco: Never   Substance Use Topics  . Alcohol use: Yes    Comment: occ  . Drug use: Never    Review of Systems  Review of Systems  Constitutional:  Negative for chills and fever.  HENT:  Negative for sore throat.   Eyes:  Negative for pain.  Respiratory:  Negative for cough and stridor.   Cardiovascular:  Negative for chest pain.  Gastrointestinal:  Negative for vomiting.  Genitourinary:  Negative for dysuria.  Musculoskeletal:  Positive for back pain (chronic, intermittent over last several years). Negative for myalgias.  Skin:  Negative for rash.  Neurological:  Positive for headaches. Negative for seizures and loss of consciousness.  Psychiatric/Behavioral:  Negative for suicidal ideas. The patient is nervous/anxious.   All other systems reviewed and are negative.    ____________________________________________   PHYSICAL EXAM:  VITAL SIGNS: ED Triage Vitals  Enc Vitals Group     BP      Pulse      Resp      Temp      Temp src      SpO2      Weight      Height      Head Circumference      Peak Flow      Pain Score      Pain Loc      Pain Edu?      Excl. in GC?    Vitals:  02/28/21 1107 02/28/21 1455  BP: 135/62 130/68  Pulse: (!) 117 100  Resp: 16 16  Temp: 99.6 F (37.6 C)   SpO2: 100% 99%   Physical Exam Vitals and nursing note reviewed.  Constitutional:      General: She is not in acute distress.    Appearance: She is well-developed.  HENT:     Head: Normocephalic and atraumatic.     Right Ear: External ear normal.     Left Ear: External ear normal.     Nose: Nose normal.     Mouth/Throat:     Mouth: Mucous membranes are moist.  Eyes:     Conjunctiva/sclera: Conjunctivae normal.  Cardiovascular:     Rate and Rhythm: Normal rate and regular rhythm.     Heart sounds: No murmur heard. Pulmonary:     Effort: Pulmonary effort is normal. No respiratory distress.     Breath sounds: Normal breath sounds.  Abdominal:     Palpations: Abdomen is soft.      Tenderness: There is no abdominal tenderness.  Musculoskeletal:     Cervical back: Neck supple.  Skin:    General: Skin is warm and dry.     Capillary Refill: Capillary refill takes less than 2 seconds.  Neurological:     General: No focal deficit present.     Mental Status: She is alert and oriented to person, place, and time.  Psychiatric:        Mood and Affect: Mood normal.    Cranial nerves II through XII grossly intact.  No pronator drift.  No finger dysmetria.  Symmetric 5/5 strength of all extremities.  Sensation intact to light touch in all extremities.  Unremarkable unassisted gait.   ____________________________________________   LABS (all labs ordered are listed, but only abnormal results are displayed)  Labs Reviewed  CBC WITH DIFFERENTIAL/PLATELET - Abnormal; Notable for the following components:      Result Value   Hemoglobin 11.3 (*)    MCV 78.4 (*)    MCH 24.4 (*)    RDW 15.7 (*)    All other components within normal limits  HCG, QUANTITATIVE, PREGNANCY  BASIC METABOLIC PANEL   ____________________________________________  EKG  Sinus tachycardia with ventricular to 102, normal axis, unremarkable intervals without clearance of acute ischemia or significant Arrhythmia. ____________________________________________  RADIOLOGY  ED MD interpretation: CT head without evidence of clear acute intracranial process though there is a partially empty sella.  MRV shows no evidence of venous sinus thrombosis although findings consistent with possible idiopathic intracranial hypertension.  MRI orbits with evidence of slightly prominent but she is without flattening of the posterior sclera or protrusion of the optic nerve heads.  No other clear acute process.   Official radiology report(s): CT Head Wo Contrast  Result Date: 02/28/2021 CLINICAL DATA:  30 year old female with frontal headache for 2 months. EXAM: CT HEAD WITHOUT CONTRAST TECHNIQUE: Contiguous axial images  were obtained from the base of the skull through the vertex without intravenous contrast. COMPARISON:  None. FINDINGS: Brain: Incidental dural calcifications, especially along the falx. No midline shift, ventriculomegaly, mass effect, evidence of mass lesion, intracranial hemorrhage or evidence of cortically based acute infarction. Gray-white matter differentiation is within normal limits throughout the brain. No encephalomalacia identified. Suggestion of partially empty sella on sagittal image 26. Vascular: No suspicious intracranial vascular hyperdensity. Skull: Negative. Sinuses/Orbits: Visualized paranasal sinuses and mastoids are clear. Other: Visualized orbits and scalp soft tissues are within normal limits. IMPRESSION: Normal noncontrast Head CT  aside from partially empty sella, which is often a normal anatomic variant but can be associated with idiopathic intracranial hypertension (pseudotumor cerebri). Electronically Signed   By: Odessa Fleming M.D.   On: 02/28/2021 11:56   MR MRV HEAD W WO CONTRAST  Result Date: 02/28/2021 CLINICAL DATA:  Vision loss. EXAM: MR VENOGRAM HEAD WITHOUT AND WITH CONTRAST TECHNIQUE: Angiographic images of the intracranial venous structures were acquired using MRV technique without and with intravenous contrast. CONTRAST:  66mL GADAVIST GADOBUTROL 1 MMOL/ML IV SOLN COMPARISON:  No pertinent prior exam. FINDINGS: Postcontrast imaging is limited by motion. No evidence of dural venous sinus thrombosis on the time-of-flight MRV. Small left transverse sinus. Narrowing of the distal right transverse sinus. Small rounded filling defects in the superior sagittal sinus and distal right transverse sinus are favored to represent arachnoid granulations. Linear filling apparent filling defect at the right jugular bulb is favored to represent flow artifact. IMPRESSION: 1. No evidence of dural venous sinus thrombosis with postcontrast imaging limited by motion. 2. Small left transverse sinus and  narrowing of the distal right transverse sinus, which is nonspecific but can be seen with idiopathic intracranial hypertension (particularly given findings on same day CT head and MRI orbits). Electronically Signed   By: Feliberto Harts MD   On: 02/28/2021 14:41   MR ORBITS W WO CONTRAST  Result Date: 02/28/2021 CLINICAL DATA:  Vision changes EXAM: MRI OF THE ORBITS WITHOUT AND WITH CONTRAST TECHNIQUE: Multiplanar, multi-echo pulse sequences of the orbits and surrounding structures were acquired including fat saturation techniques, before and after intravenous contrast administration. CONTRAST:  32mL GADAVIST GADOBUTROL 1 MMOL/ML IV SOLN COMPARISON:  Same day CT head. FINDINGS: Orbits: Mildly tortuosity of the optic nerves with mild prominence of the optic nerve sheaths bilaterally. No clear flattening of the posterior sclera or protrusion of the optic nerve heads to suggest papilledema. No orbital mass or evidence of inflammation. Normal appearance of the globes, extraocular muscles, orbital fat and lacrimal glands. Motion limited postcontrast imaging without obvious evidence of abnormal enhancement. Visualized sinuses: Clear sinuses. Soft tissues: Normal. Limited intracranial: No acute or significant finding. IMPRESSION: Mildly tortuosity of the optic nerves bilaterally with mild prominence of the optic nerve sheaths, which is nonspecific but can be seen with increased intracranial pressure (including idiopathic intracranial hypertension). No clear flattening of the posterior sclera or protrusion of the optic nerve heads to suggest papilledema although funduscopic exam could better evaluate if clinically indicated. Electronically Signed   By: Feliberto Harts MD   On: 02/28/2021 14:32    ____________________________________________   PROCEDURES  Procedure(s) performed (including Critical Care):  Procedures   ____________________________________________   INITIAL IMPRESSION / ASSESSMENT AND  PLAN / ED COURSE      Patient presents with above-stated history exam for assessment of frontal headache described as on and off for last 2 months.  She states it is no different today but has been quite annoying and will go away.  She was endorses some chronic tightness in her shoulders.  On arrival she is tachycardic at 117 with stable vital signs on room air.  He denies any other associated symptoms although endorses some anxiety which he states is also chronic but currently untreated.  Differential includes possible migraine versus other primary headache versus metabolic derangements versus sinus thrombosis versus pseudotumor cerebri.   CT head without evidence of clear acute intracranial process though there is a partially empty sella.  MRV shows no evidence of venous sinus thrombosis although findings consistent  with possible idiopathic intracranial hypertension.  MRI orbits with evidence of slightly prominent but she is without flattening of the posterior sclera or protrusion of the optic nerve heads.  No other clear acute process.   CBC is unremarkable.  BMP unremarkable.  Discussed with patient concern for pseudotumor and strong recommendation to obtain a lumbar puncture to measure CSF pressure.  However patient declines this.  Advised patient that I am concerned for headache and possible vision changes related to elevated intracranial pressures and strongly recommended close outpatient ophthalmology and neurology follow-up.  She is amenable to this.  Will defer acetazolamide pending formal diagnosis with CSF pressure.  Strict return precautions advised discussed.  Discharged stable condition.      ____________________________________________   FINAL CLINICAL IMPRESSION(S) / ED DIAGNOSES  Final diagnoses:  Nonintractable headache, unspecified chronicity pattern, unspecified headache type  Pseudotumor cerebri    Medications  gadobutrol (GADAVIST) 1 MMOL/ML injection 10 mL (10 mLs  Intravenous Contrast Given 02/28/21 1419)     ED Discharge Orders     None        Note:  This document was prepared using Dragon voice recognition software and may include unintentional dictation errors.    Gilles ChiquitoSmith, Cain Fitzhenry P, MD 02/28/21 [phone]

## 2021-02-28 NOTE — ED Triage Notes (Signed)
Presents  with frontal h/a off and on for 2 months   describes as pressure

## 2021-02-28 NOTE — ED Notes (Signed)
Patient transported to MRI 

## 2021-03-19 ENCOUNTER — Encounter: Payer: Self-pay | Admitting: Nurse Practitioner

## 2021-03-19 ENCOUNTER — Other Ambulatory Visit: Payer: Self-pay | Admitting: Nurse Practitioner

## 2021-03-19 DIAGNOSIS — M5489 Other dorsalgia: Secondary | ICD-10-CM | POA: Insufficient documentation

## 2021-03-19 DIAGNOSIS — E282 Polycystic ovarian syndrome: Secondary | ICD-10-CM | POA: Insufficient documentation

## 2021-03-19 DIAGNOSIS — F411 Generalized anxiety disorder: Secondary | ICD-10-CM | POA: Insufficient documentation

## 2021-03-19 DIAGNOSIS — M542 Cervicalgia: Secondary | ICD-10-CM | POA: Insufficient documentation

## 2021-03-19 DIAGNOSIS — R519 Headache, unspecified: Secondary | ICD-10-CM | POA: Insufficient documentation

## 2021-03-20 ENCOUNTER — Ambulatory Visit (INDEPENDENT_AMBULATORY_CARE_PROVIDER_SITE_OTHER): Payer: 59 | Admitting: Nurse Practitioner

## 2021-03-20 ENCOUNTER — Other Ambulatory Visit: Payer: Self-pay

## 2021-03-20 ENCOUNTER — Encounter: Payer: Self-pay | Admitting: Nurse Practitioner

## 2021-03-20 VITALS — BP 97/67 | HR 90 | Temp 98.8°F | Ht 69.0 in | Wt 274.0 lb

## 2021-03-20 DIAGNOSIS — E282 Polycystic ovarian syndrome: Secondary | ICD-10-CM

## 2021-03-20 DIAGNOSIS — F321 Major depressive disorder, single episode, moderate: Secondary | ICD-10-CM | POA: Diagnosis not present

## 2021-03-20 DIAGNOSIS — E669 Obesity, unspecified: Secondary | ICD-10-CM | POA: Insufficient documentation

## 2021-03-20 DIAGNOSIS — Z7689 Persons encountering health services in other specified circumstances: Secondary | ICD-10-CM | POA: Diagnosis not present

## 2021-03-20 DIAGNOSIS — E559 Vitamin D deficiency, unspecified: Secondary | ICD-10-CM

## 2021-03-20 DIAGNOSIS — R519 Headache, unspecified: Secondary | ICD-10-CM

## 2021-03-20 DIAGNOSIS — Z6841 Body Mass Index (BMI) 40.0 and over, adult: Secondary | ICD-10-CM

## 2021-03-20 DIAGNOSIS — N920 Excessive and frequent menstruation with regular cycle: Secondary | ICD-10-CM | POA: Insufficient documentation

## 2021-03-20 DIAGNOSIS — N921 Excessive and frequent menstruation with irregular cycle: Secondary | ICD-10-CM

## 2021-03-20 DIAGNOSIS — B354 Tinea corporis: Secondary | ICD-10-CM

## 2021-03-20 DIAGNOSIS — F411 Generalized anxiety disorder: Secondary | ICD-10-CM

## 2021-03-20 DIAGNOSIS — D508 Other iron deficiency anemias: Secondary | ICD-10-CM | POA: Insufficient documentation

## 2021-03-20 LAB — BAYER DCA HB A1C WAIVED: HB A1C (BAYER DCA - WAIVED): 5.1 % (ref <7.0)

## 2021-03-20 MED ORDER — NYSTATIN 100000 UNIT/GM EX CREA: 1.0000 "application " | TOPICAL_CREAM | Freq: Two times a day (BID) | CUTANEOUS | 2 refills | Status: DC

## 2021-03-20 MED ORDER — CITALOPRAM HYDROBROMIDE 10 MG PO TABS: 10.0000 mg | ORAL_TABLET | Freq: Every day | ORAL | 3 refills | Status: DC

## 2021-03-20 MED ORDER — NYSTATIN 100000 UNIT/GM EX POWD: 1.0000 "application " | Freq: Three times a day (TID) | CUTANEOUS | 4 refills | Status: DC

## 2021-03-20 NOTE — Assessment & Plan Note (Signed)
BMI 40.46.  Recommended eating smaller high protein, low fat meals more frequently and exercising 30 mins a day 5 times a week with a goal of 10-15lb weight loss in the next 3 months. Patient voiced their understanding and motivation to adhere to these recommendations.

## 2021-03-20 NOTE — Assessment & Plan Note (Signed)
Chronic, ongoing since teen years with history of self cutting in teen years.  Denies SI/HI. GAD7 and PHQ9 = 19.  Concern for isolation due to her anxiety, but she does endorse sister is here which is helpful and mother moving locally.  Educated her on treatment options, including no treatment which is current regimen.   At this time will start Celexa 10 MG daily and recommend she start Topamax as ordered by neurology, educated her this can enhance the effect of her Celexa, so start low and monitor.  Consider therapy in future -- may benefit from virtual. Educated her on medication and side effects + not to cold [place] stop it.  Return in 6 weeks.  Labs: CBC, CMP, TSH today.

## 2021-03-20 NOTE — Patient Instructions (Signed)

## 2021-03-20 NOTE — Assessment & Plan Note (Signed)
Chronic, ongoing issue with heavier menstrual cycles.  She does not wish to take BCP anymore due to her new idiopathic intercranial hypertension diagnosis.  Is scheduled to follow-up with GYN at end of July and discussed with her options such as non-hormone IUD or ?ablation if candidate.  She reports ongoing issues with her menstrual cycles.  Check CBC, iron/ferritin, B12, and CMP today.  Recommend she take iron supplement daily with some orange juice.  Return in 6 weeks.  Consider hematology if no improvement with oral.

## 2021-03-20 NOTE — Assessment & Plan Note (Signed)
Followed by GYN currently and scheduled to return.  Check A1c and CBC + CMP today.

## 2021-03-20 NOTE — Assessment & Plan Note (Signed)
Is scheduled to follow-up with GYN at end of July and discussed with her options such as non-hormone IUD or ?ablation if candidate.  She reports ongoing issues with her menstrual cycles.  Check CBC, iron/ferritin, B12, and CMP today.

## 2021-03-20 NOTE — Assessment & Plan Note (Signed)
Chronic, ongoing since teen years with history of self cutting in teen years.  Denies SI/HI. GAD7 and PHQ9 = 19.  Educated her on treatment options, including no treatment which is current regimen.   At this time will start Celexa 10 MG daily and recommend she start Topamax as ordered by neurology, educated her this can enhance the effect of her Celexa, so start low and monitor.  Consider therapy in future -- may benefit from virtual. Educated her on medication and side effects + not to cold [place] stop it.  Return in 6 weeks.  Labs: CBC, CMP, TSH today.
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
## 2021-03-20 NOTE — Assessment & Plan Note (Signed)
Possible pseudotumor cerebri (idiopathic intercranial hypertension) noted on MRI and CT in ER 02/28/21 == seeing neuro and eye doctor.  Will continue this collaboration and recent note reviewed.  Recommend she start taking Topamax as ordered and had discussion on this with her.  Labs today CBC, CMP, TSH.

## 2021-03-20 NOTE — Assessment & Plan Note (Signed)
Ongoing with intermittent flares, none present today.  Will send in Nystatin powder for under breasts and cream for neck to use when flare presents.  If ongoing or worsening return to office.

## 2021-03-21 LAB — COMPREHENSIVE METABOLIC PANEL
ALT: 41 IU/L — ABNORMAL HIGH (ref 0–32)
AST: 37 IU/L (ref 0–40)
Albumin/Globulin Ratio: 1.5 (ref 1.2–2.2)
Albumin: 4.5 g/dL (ref 3.9–5.0)
Alkaline Phosphatase: 150 IU/L — ABNORMAL HIGH (ref 44–121)
BUN/Creatinine Ratio: 9 (ref 9–23)
BUN: 9 mg/dL (ref 6–20)
Bilirubin Total: 0.4 mg/dL (ref 0.0–1.2)
CO2: 23 mmol/L (ref 20–29)
Calcium: 9.3 mg/dL (ref 8.7–10.2)
Chloride: 99 mmol/L (ref 96–106)
Creatinine, Ser: 0.98 mg/dL (ref 0.57–1.00)
Globulin, Total: 3.1 g/dL (ref 1.5–4.5)
Glucose: 91 mg/dL (ref 65–99)
Potassium: 3.9 mmol/L (ref 3.5–5.2)
Sodium: 138 mmol/L (ref 134–144)
Total Protein: 7.6 g/dL (ref 6.0–8.5)
eGFR: 81 mL/min/{1.73_m2} (ref >59)

## 2021-03-21 LAB — CBC WITH DIFFERENTIAL/PLATELET
Basophils Absolute: 0 10*3/uL (ref 0.0–0.2)
Basos: 0 %
EOS (ABSOLUTE): 0 10*3/uL (ref 0.0–0.4)
Eos: 1 %
Hematocrit: 31.6 % — ABNORMAL LOW (ref 34.0–46.6)
Hemoglobin: 9.4 g/dL — ABNORMAL LOW (ref 11.1–15.9)
Immature Grans (Abs): 0 10*3/uL (ref 0.0–0.1)
Immature Granulocytes: 0 %
Lymphocytes Absolute: 1.8 10*3/uL (ref 0.7–3.1)
Lymphs: 31 %
MCH: 23.7 pg — ABNORMAL LOW (ref 26.6–33.0)
MCHC: 29.7 g/dL — ABNORMAL LOW (ref 31.5–35.7)
MCV: 80 fL (ref 79–97)
Monocytes Absolute: 0.3 10*3/uL (ref 0.1–0.9)
Monocytes: 6 %
Neutrophils Absolute: 3.6 10*3/uL (ref 1.4–7.0)
Neutrophils: 62 %
Platelets: 191 10*3/uL (ref 150–450)
RBC: 3.96 x10E6/uL (ref 3.77–5.28)
RDW: 15.3 % (ref 11.7–15.4)
WBC: 5.8 10*3/uL (ref 3.4–10.8)

## 2021-03-21 LAB — IRON,TIBC AND FERRITIN PANEL
Ferritin: 10 ng/mL — ABNORMAL LOW (ref 15–150)
Iron Saturation: 45 % (ref 15–55)
Iron: 203 ug/dL — ABNORMAL HIGH (ref 27–159)
Total Iron Binding Capacity: 454 ug/dL — ABNORMAL HIGH (ref 250–450)
UIBC: 251 ug/dL (ref 131–425)

## 2021-03-21 LAB — VITAMIN D 25 HYDROXY (VIT D DEFICIENCY, FRACTURES): Vit D, 25-Hydroxy: 28.5 ng/mL — ABNORMAL LOW (ref 30.0–100.0)

## 2021-03-21 LAB — TSH: TSH: 2.24 u[IU]/mL (ref 0.450–4.500)

## 2021-03-21 LAB — VITAMIN B12: Vitamin B-12: 564 pg/mL (ref 232–1245)

## 2021-03-21 NOTE — Progress Notes (Signed)
Good morning, please let Anna Douglas know her labs have returned: - Kidney function is stable and liver function overall stable. - Thyroid lab is normal - Vitamin D level is slightly low, I recommend starting Vitamin D3 2000 units by mouth daily -- can obtain in any vitamin section, and B12 level is normal. - CBC continues to show some ongoing anemia with a low hemoglobin and hematocrit, but iron level is elevated -- you had mentioned your do not consistently take iron supplement though.  I would like to send you to hematology for further assessment of this anemia, since iron is slightly above normal.  Would you be okay with referral?  If so I will place this.  Any questions? Keep being awesome!!  Thank you for allowing me to participate in your care.  I appreciate you. Kindest regards, Faten Frieson

## 2021-03-22 NOTE — Addendum Note (Signed)
Addended by: Aura Dials T on: 03/22/2021 01:59 PM   Modules accepted: Orders

## 2021-03-28 ENCOUNTER — Ambulatory Visit: Payer: Self-pay | Admitting: *Deleted

## 2021-03-28 NOTE — Telephone Encounter (Signed)
Patient aware of provider advise. Has apt with neurology tomorrow.

## 2021-03-28 NOTE — Telephone Encounter (Signed)
Patient called back with concerns and questions about medication, topomax-tshe has been aking 6 days, and says feeling weird, shock feeling in head, and  felt like falling. She wants to know if she should stop taking all together or not. Please call back   Call to patient- patient was given Rx for Topamax 6/21- but did not start until 1 week ago. Patient state she started having symptoms after she started the medication- so she stopped it 2 days ago. Patient states her symptoms are going away- advised if SE do not totally go away to let PCP know. She has not started the Celexa yet- she plans to start that tomorrow- or next day. Advised will send note for provider review- reassured that she did the correct thing by stopping medication and since she had only been taking for 6 days- it was fine to stop and not wean off.  Reason for Disposition  [1] Caller has URGENT medicine question about med that PCP or specialist prescribed AND [2] triager unable to answer question  Answer Assessment - Initial Assessment Questions 1. NAME of MEDICATION: "What medicine are you calling about?"     Topamax 2. QUESTION: "What is your question?" (e.g., double dose of medicine, side effect)     Side effects 3. PRESCRIBING HCP: "Who prescribed it?" Reason: if prescribed by specialist, call should be referred to that group.     PCP 4. SYMPTOMS: "Do you have any symptoms?"     Legs weak,muscle aches, flashes of light in eyes, "zaps" in head 5. SEVERITY: If symptoms are present, ask "Are they mild, moderate or severe?"     severe Patient states she did not start rx until about 8 days ago- she has stooped for 2 days and she states her SE are getting better.  Protocols used: Medication Question Call-A-AH

## 2021-04-02 ENCOUNTER — Encounter: Payer: Self-pay | Admitting: Oncology

## 2021-04-02 ENCOUNTER — Other Ambulatory Visit: Payer: Self-pay | Admitting: Physician Assistant

## 2021-04-02 ENCOUNTER — Inpatient Hospital Stay: Payer: 59 | Attending: Oncology | Admitting: Oncology

## 2021-04-02 ENCOUNTER — Other Ambulatory Visit: Payer: Self-pay

## 2021-04-02 ENCOUNTER — Inpatient Hospital Stay: Payer: 59

## 2021-04-02 VITALS — BP 118/72 | HR 94 | Temp 99.2°F | Resp 16 | Wt 267.2 lb

## 2021-04-02 DIAGNOSIS — D500 Iron deficiency anemia secondary to blood loss (chronic): Secondary | ICD-10-CM | POA: Diagnosis present

## 2021-04-02 DIAGNOSIS — G932 Benign intracranial hypertension: Secondary | ICD-10-CM

## 2021-04-02 DIAGNOSIS — N921 Excessive and frequent menstruation with irregular cycle: Secondary | ICD-10-CM | POA: Diagnosis not present

## 2021-04-02 MED ORDER — VITRON-C 65-125 MG PO TABS: 1.0000 | ORAL_TABLET | Freq: Every day | ORAL | 3 refills | Status: AC

## 2021-04-02 NOTE — Progress Notes (Signed)
New patient evaluation.   

## 2021-04-02 NOTE — Progress Notes (Signed)
Hematology/Oncology Consult note [facility] [phone] [phone]   Patient Care Team: Marjie Skiff, NP as PCP - General (Nurse Practitioner)  REFERRING PROVIDER: Marjie Skiff, NP CHIEF COMPLAINTS/REASON FOR VISIT:  Evaluation of iron deficiency anemia  HISTORY OF PRESENTING ILLNESS:  Anna Douglas is a  30 y.o.  female with PMH listed below was seen in consultation at the request of Aura Dials T, NP  for evaluation of iron deficiency anemia.   Reviewed patient's recent labs  7/5/2022Labs revealed anemia with hemoglobin of 9.4, MCV 80, Iron 203, TIBC 454, ferritin 10, iron saturation 45. Reviewed patient's prior blood work in [employer]. Anemia has been chronic, since at least July 2021.  Associated signs and symptoms: Patient reports fatigue.  Denies SOB with exertion.  Denies weight loss, easy bruising, hematochezia, hemoptysis, hematuria. Patient denies any bloody or black stool. She has heavy menstrual period for which she is going to see gynecology this week. Took a few doses of over-the-counter iron supplementation. Occasional nausea which she contributes to her idiopathic intracranial hypertension.  She takes Diamox. She also has PCOS syndrome.  Grandmother reports that anemia runs in the family.  Review of Systems  Constitutional:  Positive for fatigue. Negative for appetite change, chills and fever.  HENT:   Negative for hearing loss and voice change.   Eyes:  Negative for eye problems.  Respiratory:  Negative for chest tightness and cough.   Cardiovascular:  Negative for chest pain.  Gastrointestinal:  Positive for nausea. Negative for abdominal distention, abdominal pain and blood in stool.  Endocrine: Negative for hot flashes.  Genitourinary:  Positive for menstrual problem. Negative for difficulty urinating and frequency.   Musculoskeletal:  Negative for arthralgias.  Skin:  Negative for itching  and rash.  Neurological:  Negative for extremity weakness.  Hematological:  Negative for adenopathy.  Psychiatric/Behavioral:  Negative for confusion.    MEDICAL HISTORY:  Past Medical History:  Diagnosis Date   Anxiety    Depression    Idiopathic intracranial hypertension    Polycystic ovarian disease     SURGICAL HISTORY: Past Surgical History:  Procedure Laterality Date   NO PAST SURGERIES      SOCIAL HISTORY: Social History   Socioeconomic History   Marital status: Single    Spouse name: Not on file   Number of children: Not on file   Years of education: Not on file   Highest education level: Not on file  Occupational History   Not on file  Tobacco Use   Smoking status: Never   Smokeless tobacco: Never  Substance and Sexual Activity   Alcohol use: Yes    Comment: occ   Drug use: Never   Sexual activity: Not Currently  Other Topics Concern   Not on file  Social History Narrative   Not on file   Social Determinants of Health   Financial Resource Strain: Low Risk    Difficulty of Paying Living Expenses: Not very hard  Food Insecurity: No Food Insecurity   Worried About Running Out of Food in the Last Year: Never true   Ran Out of Food in the Last Year: Never true  Transportation Needs: No Transportation Needs   Lack of Transportation (Medical): No   Lack of Transportation (Non-Medical): No  Physical Activity: Inactive   Days of Exercise per Week: 0 days   Minutes of Exercise per Session: 0 min  Stress: No Stress Concern Present   Feeling of Stress :  Only a little  Social Connections: Socially Isolated   Frequency of Communication with Friends and Family: Once a week   Frequency of Social Gatherings with Friends and Family: Once a week   Attends Religious Services: Never   Database administratorActive Member of Clubs or Organizations: No   Attends Engineer, structuralClub or Organization Meetings: Never   Marital Status: Never married  Catering managerntimate Partner Violence: Not At Risk   Fear of Current  or Ex-Partner: No   Emotionally Abused: No   Physically Abused: No   Sexually Abused: No    FAMILY HISTORY: Family History  Problem Relation Age of Onset   Hypertension Mother    Depression Father    Diabetes Father    Healthy Brother    Healthy Maternal Grandmother    Kidney disease Maternal Grandfather    Liver disease Maternal Grandfather    Diabetes Paternal Grandmother    Arthritis Paternal Grandmother    Healthy Daughter    Cancer Neg Hx     ALLERGIES:  has No Known Allergies.  MEDICATIONS:  Current Outpatient Medications  Medication Sig Dispense Refill   acetaZOLAMIDE (DIAMOX) 125 MG tablet Take by mouth.     Iron-Vitamin C (VITRON-C) 65-125 MG TABS Take 1 tablet by mouth daily with lunch. 30 tablet 3   citalopram (CELEXA) 10 MG tablet Take 1 tablet (10 mg total) by mouth daily. (Patient not taking: Reported on 04/02/2021) 30 tablet 3   nystatin (MYCOSTATIN/NYSTOP) powder Apply 1 application topically 3 (three) times daily. (Patient not taking: Reported on 04/02/2021) 15 g 4   nystatin cream (MYCOSTATIN) Apply 1 application topically 2 (two) times daily. (Patient not taking: Reported on 04/02/2021) 30 g 2   topiramate (TOPAMAX) 25 MG tablet Start taking topamax 25mg  nightly for one week, then 50mg  for one week, then 75mg . You CANNOT take this if you are pregnant or may become pregnant. (Patient not taking: Reported on 04/02/2021)     No current facility-administered medications for this visit.     PHYSICAL EXAMINATION: ECOG PERFORMANCE STATUS: 0 - Asymptomatic Vitals:   04/02/21 1059  BP: 118/72  Pulse: 94  Resp: 16  Temp: 99.2 F (37.3 C)   Filed Weights   04/02/21 1059  Weight: 267 lb 3.2 oz (121.2 kg)    Physical Exam Constitutional:      General: She is not in acute distress. HENT:     Head: Normocephalic and atraumatic.  Eyes:     General: No scleral icterus. Cardiovascular:     Rate and Rhythm: Normal rate and regular rhythm.     Heart sounds:  Normal heart sounds.  Pulmonary:     Effort: Pulmonary effort is normal. No respiratory distress.     Breath sounds: No wheezing.  Abdominal:     General: Bowel sounds are normal. There is no distension.     Palpations: Abdomen is soft.  Musculoskeletal:        General: No deformity. Normal range of motion.     Cervical back: Normal range of motion and neck supple.  Skin:    General: Skin is warm and dry.     Findings: No erythema or rash.  Neurological:     Mental Status: She is alert and oriented to person, place, and time. Mental status is at baseline.     Cranial Nerves: No cranial nerve deficit.     Coordination: Coordination normal.  Psychiatric:        Mood and Affect: Mood normal.  CMP Latest Ref Rng & Units 03/20/2021  Glucose 65 - 99 mg/dL 91  BUN 6 - 20 mg/dL 9  Creatinine 5.70 - 1.77 mg/dL 9.39  Sodium 030 - 092 mmol/L 138  Potassium 3.5 - 5.2 mmol/L 3.9  Chloride 96 - 106 mmol/L 99  CO2 20 - 29 mmol/L 23  Calcium 8.7 - 10.2 mg/dL 9.3  Total Protein 6.0 - 8.5 g/dL 7.6  Total Bilirubin 0.0 - 1.2 mg/dL 0.4  Alkaline Phos 44 - 121 IU/L 150(H)  AST 0 - 40 IU/L 37  ALT 0 - 32 IU/L 41(H)   CBC Latest Ref Rng & Units 03/20/2021  WBC 3.4 - 10.8 x10E3/uL 5.8  Hemoglobin 11.1 - 15.9 g/dL 3.3(A)  Hematocrit 07.6 - 46.6 % 31.6(L)  Platelets 150 - 450 x10E3/uL 191     LABORATORY DATA:  I have reviewed the data as listed Lab Results  Component Value Date   WBC 5.8 03/20/2021   HGB 9.4 (L) 03/20/2021   HCT 31.6 (L) 03/20/2021   MCV 80 03/20/2021   PLT 191 03/20/2021   Recent Labs    04/09/20 2044 10/11/20 2335 02/28/21 1124 03/20/21 1208  NA 135 136 135 138  K 3.8 3.7 3.9 3.9  CL 104 102 104 99  CO2 22 24 24 23   GLUCOSE 96 106* 90 91  BUN 9 13 12 9   CREATININE 0.92 0.80 0.80 0.98  CALCIUM 8.5* 8.7* 9.2 9.3  GFRNONAA >60 >60 >60  --   GFRAA >60  --   --   --   PROT 7.6 7.8  --  7.6  ALBUMIN 3.9 3.9  --  4.5  AST 32 26  --  37  ALT 30 27  --  41*   ALKPHOS 98 102  --  150*  BILITOT 0.7 0.7  --  0.4   Iron/TIBC/Ferritin/ %Sat    Component Value Date/Time   IRON 203 (H) 03/20/2021 1208   TIBC 454 (H) 03/20/2021 1208   FERRITIN 10 (L) 03/20/2021 1208   IRONPCTSAT 45 03/20/2021 1208     RADIOGRAPHIC STUDIES: I have personally reviewed the radiological images as listed and agreed with the findings in the report.CT Head Wo Contrast  Result Date: 02/28/2021 CLINICAL DATA:  30 year old female with frontal headache for 2 months. EXAM: CT HEAD WITHOUT CONTRAST TECHNIQUE: Contiguous axial images were obtained from the base of the skull through the vertex without intravenous contrast. COMPARISON:  None. FINDINGS: Brain: Incidental dural calcifications, especially along the falx. No midline shift, ventriculomegaly, mass effect, evidence of mass lesion, intracranial hemorrhage or evidence of cortically based acute infarction. Gray-white matter differentiation is within normal limits throughout the brain. No encephalomalacia identified. Suggestion of partially empty sella on sagittal image 26. Vascular: No suspicious intracranial vascular hyperdensity. Skull: Negative. Sinuses/Orbits: Visualized paranasal sinuses and mastoids are clear. Other: Visualized orbits and scalp soft tissues are within normal limits. IMPRESSION: Normal noncontrast Head CT aside from partially empty sella, which is often a normal anatomic variant but can be associated with idiopathic intracranial hypertension (pseudotumor cerebri). Electronically Signed   By: 03/02/2021 M.D.   On: 02/28/2021 11:56   MR MRV HEAD W WO CONTRAST  Result Date: 02/28/2021 CLINICAL DATA:  Vision loss. EXAM: MR VENOGRAM HEAD WITHOUT AND WITH CONTRAST TECHNIQUE: Angiographic images of the intracranial venous structures were acquired using MRV technique without and with intravenous contrast. CONTRAST:  2mL GADAVIST GADOBUTROL 1 MMOL/ML IV SOLN COMPARISON:  No pertinent prior exam. FINDINGS: Postcontrast  imaging is limited by motion. No evidence of dural venous sinus thrombosis on the time-of-flight MRV. Small left transverse sinus. Narrowing of the distal right transverse sinus. Small rounded filling defects in the superior sagittal sinus and distal right transverse sinus are favored to represent arachnoid granulations. Linear filling apparent filling defect at the right jugular bulb is favored to represent flow artifact. IMPRESSION: 1. No evidence of dural venous sinus thrombosis with postcontrast imaging limited by motion. 2. Small left transverse sinus and narrowing of the distal right transverse sinus, which is nonspecific but can be seen with idiopathic intracranial hypertension (particularly given findings on same day CT head and MRI orbits). Electronically Signed   By: Feliberto Harts MD   On: 02/28/2021 14:41   MR ORBITS W WO CONTRAST  Result Date: 02/28/2021 CLINICAL DATA:  Vision changes EXAM: MRI OF THE ORBITS WITHOUT AND WITH CONTRAST TECHNIQUE: Multiplanar, multi-echo pulse sequences of the orbits and surrounding structures were acquired including fat saturation techniques, before and after intravenous contrast administration. CONTRAST:  71mL GADAVIST GADOBUTROL 1 MMOL/ML IV SOLN COMPARISON:  Same day CT head. FINDINGS: Orbits: Mildly tortuosity of the optic nerves with mild prominence of the optic nerve sheaths bilaterally. No clear flattening of the posterior sclera or protrusion of the optic nerve heads to suggest papilledema. No orbital mass or evidence of inflammation. Normal appearance of the globes, extraocular muscles, orbital fat and lacrimal glands. Motion limited postcontrast imaging without obvious evidence of abnormal enhancement. Visualized sinuses: Clear sinuses. Soft tissues: Normal. Limited intracranial: No acute or significant finding. IMPRESSION: Mildly tortuosity of the optic nerves bilaterally with mild prominence of the optic nerve sheaths, which is nonspecific but can be  seen with increased intracranial pressure (including idiopathic intracranial hypertension). No clear flattening of the posterior sclera or protrusion of the optic nerve heads to suggest papilledema although funduscopic exam could better evaluate if clinically indicated. Electronically Signed   By: Feliberto Harts MD   On: 02/28/2021 14:32       ASSESSMENT & PLAN:  1. Iron deficiency anemia due to chronic blood loss   2. Menorrhagia with irregular cycle     Labs are reviewed and discussed with patient. Consistent with iron deficiency anemia. I discussed with patient about options of oral iron supplementation vs IV iron infusion. Allergy reactions/infusion reaction including anaphylactic reaction discussed with patient. Other side effects include but not limited to high blood pressure, skin rash, weight gain, leg swelling, etc. patient understands that there is not enough safety data for IV Venofer use during first trimester.  Patient reports that she is not sexually active.  I will not obtain urine pregnancy testing prior to IV Venofer treatments. Patient voices understanding and she hopes to try oral iron supplementation first. Recommend patient to take Vitron-C 1 tablet daily with meals. Discussed about stool softener use if she gets constipation.  Menorrhagia, establish care with gynecology. Patient will follow-up with me in 8 weeks, with labs prior to virtual MD +/- Venofer treatments    All questions were answered. The patient knows to call the clinic with any problems questions or concerns.  Cc Aura Dials T, NP  Return of visit: 8 weeks Thank you for this kind referral and the opportunity to participate in the care of this patient. A copy of today's note is routed to referring provider   Rickard Patience, MD, PhD Hematology Oncology [facility] 04/02/2021

## 2021-04-12 ENCOUNTER — Ambulatory Visit: Admission: RE | Admit: 2021-04-12 | Payer: 59 | Source: Ambulatory Visit

## 2021-04-20 ENCOUNTER — Other Ambulatory Visit: Payer: Self-pay

## 2021-04-20 ENCOUNTER — Ambulatory Visit
Admission: RE | Admit: 2021-04-20 | Discharge: 2021-04-20 | Disposition: A | Discharge status: Home / Self Care | Payer: 59 | Source: Ambulatory Visit | Attending: Physician Assistant | Admitting: Physician Assistant

## 2021-04-20 DIAGNOSIS — G932 Benign intracranial hypertension: Secondary | ICD-10-CM | POA: Diagnosis not present

## 2021-04-20 LAB — PREGNANCY, URINE: Preg Test, Ur: NEGATIVE

## 2021-04-20 IMAGING — RF DG FLUORO GUIDE SPINAL/SI JT INJ*L*
1 series · 1 of 1 positions shown · non-contrast
Comparison: [DATE] CT

CLINICAL DATA: Headaches, assess for pseudotumor cerebri

EXAM:
DIAGNOSTIC LUMBAR PUNCTURE UNDER FLUOROSCOPIC GUIDANCE

[Series 1: cp_standard · 0.18mm/px · 1 of 1 slices shown]
[im 1/1]
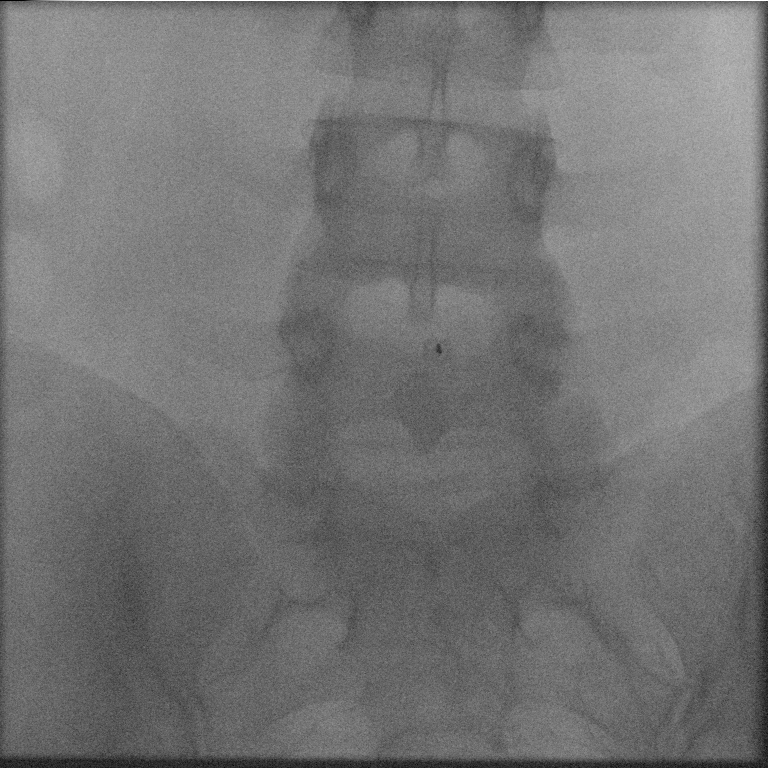

[1 of 1 positions shown; findings below may reference images not displayed]

FLUOROSCOPY TIME:  Fluoroscopy Time:  42 seconds

Radiation Exposure Index (if provided by the fluoroscopic device):
16 mGy

Number of Acquired Spot Images: 1

PROCEDURE:
Informed consent was obtained from the patient prior to the
procedure, including potential complications of headache, allergy,
and pain. With the patient prone, the lower back was prepped with
Betadine. 1% Lidocaine was used for local anesthesia. Lumbar
puncture was performed at the L4-5 level using a 22 gauge gauge
needle (5 inch) with return of clear CSF with an opening pressure of
21 cm water. Because of this normal pressure, no CSF was withdrawn
for therapeutic purposes. Needle removed. The patient tolerated the
procedure well and there were no apparent complications.
IMPRESSION: Opening pressure 21 cm water (Within normal limits)

## 2021-04-20 MED ORDER — ACETAMINOPHEN 500 MG PO TABS
1000.0000 mg | ORAL_TABLET | Freq: Four times a day (QID) | ORAL | Status: DC | PRN
  Filled 2021-04-20: qty 2

## 2021-04-20 MED ORDER — LIDOCAINE HCL (PF) 1 % IJ SOLN
5.0000 mL | Freq: Once | INTRAMUSCULAR | Status: AC
  Administered 2021-04-20: 5 mL via INTRADERMAL
  Filled 2021-04-20: qty 5

## 2021-04-20 NOTE — Progress Notes (Signed)
Pt was handed off to [employer], Charity fundraiser.

## 2021-04-20 NOTE — Procedures (Signed)
Technically successful fluoro guided LP with opening pressure of 21 mmHg, CSF clear. No labs ordered. No fluid was removed due to opening pressure within normal range. No immediate post procedural complication.  Lynnette Caffey, PA-C

## 2021-04-23 ENCOUNTER — Emergency Department
Admission: EM | Admit: 2021-04-23 | Discharge: 2021-04-23 | Disposition: A | Discharge status: Home / Self Care | Payer: 59 | Attending: Emergency Medicine | Admitting: Emergency Medicine

## 2021-04-23 ENCOUNTER — Other Ambulatory Visit: Payer: Self-pay

## 2021-04-23 ENCOUNTER — Encounter: Payer: Self-pay | Admitting: Emergency Medicine

## 2021-04-23 DIAGNOSIS — G971 Other reaction to spinal and lumbar puncture: Secondary | ICD-10-CM

## 2021-04-23 DIAGNOSIS — R519 Headache, unspecified: Secondary | ICD-10-CM | POA: Diagnosis present

## 2021-04-23 MED ORDER — ASPIRIN-ACETAMINOPHEN-CAFFEINE 250-250-65 MG PO TABS: 2.0000 | ORAL_TABLET | Freq: Four times a day (QID) | ORAL | 0 refills | Status: AC | PRN

## 2021-04-23 MED ORDER — ONDANSETRON HCL 4 MG/2ML IJ SOLN
4.0000 mg | Freq: Once | INTRAMUSCULAR | Status: AC
  Administered 2021-04-23: 4 mg via INTRAVENOUS
  Filled 2021-04-23: qty 2

## 2021-04-23 MED ORDER — SODIUM CHLORIDE 0.9 % IV SOLN
500.0000 mg | Freq: Once | INTRAVENOUS | Status: AC
  Administered 2021-04-23: 500 mg via INTRAVENOUS
  Filled 2021-04-23: qty 2

## 2021-04-23 MED ORDER — LACTATED RINGERS IV BOLUS
1000.0000 mL | Freq: Once | INTRAVENOUS | Status: AC
  Administered 2021-04-23: 1000 mL via INTRAVENOUS

## 2021-04-23 NOTE — ED Provider Notes (Signed)
[facility] Emergency Department Provider Note ____________________________________________  Time seen: Approximately 3:00 PM  I have reviewed the triage vital signs and the nursing notes.   HISTORY  Chief Complaint Headache   HPI Anna Douglas is a 30 y.o. female presents to the ER for blood patch due to persistent headache after LP on 04/20/21 for pseudotumor cerebri. No relief with OTC medications and lying flat in dark room. She was afraid to drink caffeine because she is on Diamox and was afraid it would make her vomit.  Location: Top of head Similar to previous headaches: No Duration: Constant TIMING: Post LP SEVERITY:8/10 QUALITY:Throb MODIFYING FACTORS: None ASSOCIATED SYMPTOMS: Nausea  Past Medical History:  Diagnosis Date   Anxiety    Depression    Idiopathic intracranial hypertension    Polycystic ovarian disease     Patient Active Problem List   Diagnosis Date Noted   Iron deficiency anemia due to chronic blood loss 04/02/2021   Depression, major, single episode, moderate (HCC) 03/20/2021   Iron deficiency anemia secondary to inadequate dietary iron intake 03/20/2021   Menorrhagia 03/20/2021   Tinea corporis 03/20/2021   Obesity 03/20/2021   Headache disorder 03/19/2021   PCOS (polycystic ovarian syndrome) 03/19/2021   Back pain without sciatica 03/19/2021   Neck pain 03/19/2021   GAD (generalized anxiety disorder) 03/19/2021    Past Surgical History:  Procedure Laterality Date   NO PAST SURGERIES      Prior to Admission medications   Medication Sig Start Date End Date Taking? Authorizing Provider  aspirin-acetaminophen-caffeine (EXCEDRIN MIGRAINE) [phone] MG tablet Take 2 tablets by mouth every 6 (six) hours as needed for up to 7 days for headache. 04/23/21 04/30/21 Yes Daimen Shovlin B, FNP  acetaZOLAMIDE (DIAMOX) 125 MG tablet Take by mouth. 03/29/21   [provider]  citalopram (CELEXA) 10 MG tablet Take 1  tablet (10 mg total) by mouth daily. Patient not taking: Reported on 04/02/2021 03/20/21   Aura Dials T, NP  Iron-Vitamin C (VITRON-C) 65-125 MG TABS Take 1 tablet by mouth daily with lunch. 04/02/21   Rickard Patience, MD  nystatin (MYCOSTATIN/NYSTOP) powder Apply 1 application topically 3 (three) times daily. Patient not taking: Reported on 04/02/2021 03/20/21   Aura Dials T, NP  nystatin cream (MYCOSTATIN) Apply 1 application topically 2 (two) times daily. Patient not taking: Reported on 04/02/2021 03/20/21   Aura Dials T, NP  topiramate (TOPAMAX) 25 MG tablet Start taking topamax 25mg  nightly for one week, then 50mg  for one week, then 75mg . You CANNOT take this if you are pregnant or may become pregnant. Patient not taking: Reported on 04/02/2021 03/06/21   [provider]    Allergies Patient has no known allergies.  Family History  Problem Relation Age of Onset   Hypertension Mother    Depression Father    Diabetes Father    Healthy Brother    Healthy Maternal Grandmother    Kidney disease Maternal Grandfather    Liver disease Maternal Grandfather    Diabetes Paternal Grandmother    Arthritis Paternal Grandmother    Healthy Daughter    Cancer Neg Hx     Social History Social History   Tobacco Use   Smoking status: Never   Smokeless tobacco: Never  Substance Use Topics   Alcohol use: Yes    Comment: occ   Drug use: Never    Review of Systems Constitutional: No fever/chills or recent injury. Eyes: No visual changes. ENT: No sore throat. Respiratory:  Denies shortness of breath. Gastrointestinal: No abdominal pain.  No nausea, no vomiting.  No diarrhea.  No constipation. Musculoskeletal: Negative for pain. Skin: Negative for rash. Neurological:Positive for headache, Negative for focal weakness or numbness. No confusion or fainting. ___________________________________________   PHYSICAL EXAM:  VITAL SIGNS: ED Triage Vitals  Enc Vitals Group     BP  04/23/21 1350 100/85     Pulse Rate 04/23/21 1350 86     Resp 04/23/21 1350 20     Temp 04/23/21 1350 97.9 F (36.6 C)     Temp Source 04/23/21 1350 Oral     SpO2 04/23/21 1350 99 %     Weight 04/23/21 1352 264 lb (119.7 kg)     Height 04/23/21 1352 5\' 10"  (1.778 m)     Head Circumference --      Peak Flow --      Pain Score 04/23/21 1351 8     Pain Loc --      Pain Edu? --      Excl. in GC? --     Constitutional: Alert and oriented. Well appearing and in no acute distress. Eyes: Conjunctivae are normal. PERRL. EOMI without expressed pain. No evidence of papilledema on limited exam. Head: Atraumatic. Nose: No congestion/rhinnorhea. Mouth/Throat: Mucous membranes are moist.  Oropharynx non-erythematous. Neck: No stridor. Supple, no meningismus.  Cardiovascular: Normal rate, regular rhythm. Grossly normal heart sounds.  Good peripheral circulation. Respiratory: Normal respiratory effort.  No retractions. Lungs CTAB. Gastrointestinal: Soft and nontender. No distention.  Musculoskeletal: No lower extremity tenderness nor edema.  No joint effusions. Neurologic:  Normal speech and language. No gross focal neurologic deficits are appreciated. No gait instability. Cranial nerves: 2-10 normal as tested. Cerebellar:Normal Romberg, finger-nose-finger, heel to shin, normal gait. Sensorimotor: No aphasia, pronator drift, clonus, sensory loss or abnormal reflexes.  Skin:  Skin is warm, dry and intact. No rash noted. Psychiatric: Mood and affect are normal. Speech and behavior are normal. Normal thought process and cognition.  ____________________________________________   LABS (all labs ordered are listed, but only abnormal results are displayed)  Labs Reviewed - No data to display ____________________________________________  EKG  Not indicated. ____________________________________________  RADIOLOGY  No results  found. ____________________________________________   PROCEDURES  Procedure(s) performed:  Procedures  Critical Care performed: None ____________________________________________   INITIAL IMPRESSION / ASSESSMENT AND PLAN / ED COURSE  30 year old female presenting to the emergency department for evaluation of headache.  She underwent a lumbar puncture for pseudotumor cerebri on 04/20/2021.  Patient was advised to come to the emergency department for a blood patch and was advised to continue Diamox.  Because she has not tried caffeine, initial plan will be to give her some IV calf pain with fluids and Zofran to see if this gives her any relief.  If not, will discuss with either anesthesiology or are for potential blood patch.  ----------------------------------------- 5:28 PM on 04/23/2021 ----------------------------------------- IV fluids and caffeine infusing.  Patient feeling much better.  She is laying on the stretcher watching TV on her phone.  Lights turned on and she denied photophobia or worsening headache.  Will let fluids and meds continue to infuse and see how she feels upon standing.  ----------------------------------------- 7:08 PM on 04/23/2021 ----------------------------------------- Patient able to ambulate to restroom without difficulty or increase in headache. She feels ready for discharge and wants to go get food. She will be discharged home and advised to take Excedrin Migraine as directed.   She is to follow up with neurology  as scheduled. For symptoms that change or worsen, she is to return to the ER.  Pertinent labs & imaging results that were available during my care of the patient were reviewed by me and considered in my medical decision making (see chart for details). ____________________________________________   FINAL CLINICAL IMPRESSION(S) / ED DIAGNOSES  Final diagnoses:  Post-dural puncture headache    ED Discharge Orders          Ordered     aspirin-acetaminophen-caffeine (EXCEDRIN MIGRAINE) 250-250-65 MG tablet  Every 6 hours PRN        04/23/21 1912             Chinita Pester, FNP 04/23/21 1919    Phineas Semen, MD 04/23/21 [phone]

## 2021-04-23 NOTE — Discharge Instructions (Addendum)
Please take the Excedrin Migraine medication as prescribed.  Follow up with neurology as scheduled or sooner for concerns.   Return to the ER for symptoms that change or worsen.

## 2021-04-23 NOTE — ED Notes (Signed)
FIRST NURSE: Pt to ER via EMS from home reports headache and body aches after lumbar puncture on Thursday.  VSS.

## 2021-04-23 NOTE — ED Notes (Signed)
E-signature pad unavailable - Pt verbalized understanding of D/C information - no additional concerns at this time.  

## 2021-04-23 NOTE — ED Triage Notes (Signed)
Pt reports that she had a lumbar puncture on Friday and has developed a headache,she was told to come to the ED to get a blood patch.

## 2021-04-28 ENCOUNTER — Encounter: Payer: Self-pay | Admitting: Nurse Practitioner

## 2021-04-28 DIAGNOSIS — E559 Vitamin D deficiency, unspecified: Secondary | ICD-10-CM | POA: Insufficient documentation

## 2021-05-01 ENCOUNTER — Ambulatory Visit: Payer: 59 | Admitting: Nurse Practitioner

## 2021-05-14 ENCOUNTER — Ambulatory Visit: Payer: 59 | Attending: Neurology

## 2021-05-25 ENCOUNTER — Other Ambulatory Visit: Payer: Self-pay

## 2021-05-25 ENCOUNTER — Encounter: Payer: Self-pay | Admitting: Oncology

## 2021-05-25 ENCOUNTER — Inpatient Hospital Stay: Payer: 59 | Attending: Oncology

## 2021-05-25 DIAGNOSIS — D509 Iron deficiency anemia, unspecified: Secondary | ICD-10-CM | POA: Insufficient documentation

## 2021-05-25 DIAGNOSIS — D500 Iron deficiency anemia secondary to blood loss (chronic): Secondary | ICD-10-CM

## 2021-05-25 LAB — RETIC PANEL
Immature Retic Fract: 13 % (ref 2.3–15.9)
RBC.: 4.47 MIL/uL (ref 3.87–5.11)
Retic Count, Absolute: 51.4 10*3/uL (ref 19.0–186.0)
Retic Ct Pct: 1.2 % (ref 0.4–3.1)
Reticulocyte Hemoglobin: 26.2 pg — ABNORMAL LOW (ref >27.9)

## 2021-05-25 LAB — CBC WITH DIFFERENTIAL/PLATELET
Abs Immature Granulocytes: 0 10*3/uL (ref 0.00–0.07)
Basophils Absolute: 0 10*3/uL (ref 0.0–0.1)
Basophils Relative: 1 %
Eosinophils Absolute: 0 10*3/uL (ref 0.0–0.5)
Eosinophils Relative: 1 %
HCT: 36.2 % (ref 36.0–46.0)
Hemoglobin: 10.9 g/dL — ABNORMAL LOW (ref 12.0–15.0)
Immature Granulocytes: 0 %
Lymphocytes Relative: 32 %
Lymphs Abs: 1.4 10*3/uL (ref 0.7–4.0)
MCH: 23.7 pg — ABNORMAL LOW (ref 26.0–34.0)
MCHC: 30.1 g/dL (ref 30.0–36.0)
MCV: 78.9 fL — ABNORMAL LOW (ref 80.0–100.0)
Monocytes Absolute: 0.3 10*3/uL (ref 0.1–1.0)
Monocytes Relative: 7 %
Neutro Abs: 2.6 10*3/uL (ref 1.7–7.7)
Neutrophils Relative %: 59 %
Platelets: 166 10*3/uL (ref 150–400)
RBC: 4.59 MIL/uL (ref 3.87–5.11)
RDW: 17.6 % — ABNORMAL HIGH (ref 11.5–15.5)
WBC: 4.4 10*3/uL (ref 4.0–10.5)
nRBC: 0 % (ref 0.0–0.2)

## 2021-05-25 LAB — IRON AND TIBC
Iron: 28 ug/dL (ref 28–170)
Saturation Ratios: 7 % — ABNORMAL LOW (ref 10.4–31.8)
TIBC: 421 ug/dL (ref 250–450)
UIBC: 393 ug/dL

## 2021-05-25 LAB — FERRITIN: Ferritin: 7 ng/mL — ABNORMAL LOW (ref 11–307)

## 2021-05-29 ENCOUNTER — Encounter: Payer: Self-pay | Admitting: Oncology

## 2021-05-29 ENCOUNTER — Inpatient Hospital Stay (HOSPITAL_BASED_OUTPATIENT_CLINIC_OR_DEPARTMENT_OTHER): Payer: 59 | Admitting: Oncology

## 2021-05-29 DIAGNOSIS — D500 Iron deficiency anemia secondary to blood loss (chronic): Secondary | ICD-10-CM | POA: Diagnosis not present

## 2021-05-29 DIAGNOSIS — N921 Excessive and frequent menstruation with irregular cycle: Secondary | ICD-10-CM

## 2021-05-29 NOTE — Progress Notes (Signed)
Spoke with patient via telephone prior to MyChart visit. Medication list was updated. Patient stated that overall she feels good, but she has been having a light, constant period since the end of July/1st of August. She denied having any pain, shortness of breath, or other issues.

## 2021-05-29 NOTE — Progress Notes (Signed)
HEMATOLOGY-ONCOLOGY TeleHEALTH VISIT PROGRESS NOTE  I connected with Anna Douglas on 05/29/21  at  2:45 PM EDT by video enabled telemedicine visit and verified that I am speaking with the correct person using two identifiers. I discussed the limitations, risks, security and privacy concerns of performing an evaluation and management service by telemedicine and the availability of in-person appointments. The patient expressed understanding and agreed to proceed.   Other persons participating in the visit and their role in the encounter:  None  Patient's location: Home  Provider's location: office Chief Complaint: follow up for IDA   INTERVAL HISTORY Anna Douglas is a 30 y.o. female who has above history reviewed by me today presents for follow up visit for management of IDA Problems and complaints are listed below:  I attempted to connect the patient for visual enabled telehealth visit.  Due to the technical difficulties with video,  Patient was transitioned to audio only visit. Fatigue level is better.  Irregular menses, lasting 1 month   Review of Systems  Constitutional:  Negative for appetite change, chills, fatigue and fever.  HENT:   Negative for hearing loss and voice change.   Eyes:  Negative for eye problems.  Respiratory:  Negative for chest tightness and cough.   Cardiovascular:  Negative for chest pain.  Gastrointestinal:  Negative for abdominal distention, abdominal pain and blood in stool.  Endocrine: Negative for hot flashes.  Genitourinary:  Positive for vaginal bleeding. Negative for difficulty urinating and frequency.   Musculoskeletal:  Negative for arthralgias.  Skin:  Negative for itching and rash.  Neurological:  Negative for extremity weakness.  Hematological:  Negative for adenopathy.  Psychiatric/Behavioral:  Negative for confusion.    Past Medical History:  Diagnosis Date   Anxiety    Depression    Idiopathic intracranial hypertension     Polycystic ovarian disease    Past Surgical History:  Procedure Laterality Date   NO PAST SURGERIES      Family History  Problem Relation Age of Onset   Hypertension Mother    Depression Father    Diabetes Father    Healthy Brother    Healthy Maternal Grandmother    Kidney disease Maternal Grandfather    Liver disease Maternal Grandfather    Diabetes Paternal Grandmother    Arthritis Paternal Grandmother    Healthy Daughter    Cancer Neg Hx     Social History   Socioeconomic History   Marital status: Single    Spouse name: Not on file   Number of children: Not on file   Years of education: Not on file   Highest education level: Not on file  Occupational History   Not on file  Tobacco Use   Smoking status: Never   Smokeless tobacco: Never  Substance and Sexual Activity   Alcohol use: Yes    Comment: occ   Drug use: Never   Sexual activity: Not Currently  Other Topics Concern   Not on file  Social History Narrative   Not on file   Social Determinants of Health   Financial Resource Strain: Low Risk    Difficulty of Paying Living Expenses: Not very hard  Food Insecurity: No Food Insecurity   Worried About Running Out of Food in the Last Year: Never true   Ran Out of Food in the Last Year: Never true  Transportation Needs: No Transportation Needs   Lack of Transportation (Medical): No   Lack of Transportation (Non-Medical): No  Physical  Activity: Inactive   Days of Exercise per Week: 0 days   Minutes of Exercise per Session: 0 min  Stress: No Stress Concern Present   Feeling of Stress : Only a little  Social Connections: Socially Isolated   Frequency of Communication with Friends and Family: Once a week   Frequency of Social Gatherings with Friends and Family: Once a week   Attends Religious Services: Never   Database administrator or Organizations: No   Attends Engineer, structural: Never   Marital Status: Never married  Catering manager  Violence: Not At Risk   Fear of Current or Ex-Partner: No   Emotionally Abused: No   Physically Abused: No   Sexually Abused: No    Current Outpatient Medications on File Prior to Visit  Medication Sig Dispense Refill   acetaZOLAMIDE (DIAMOX) 125 MG tablet Take by mouth.     Iron-Vitamin C (VITRON-C) 65-125 MG TABS Take 1 tablet by mouth daily with lunch. 30 tablet 3   No current facility-administered medications on file prior to visit.    No Known Allergies     Observations/Objective: There were no vitals filed for this visit. There is no height or weight on file to calculate BMI.  Physical Exam  CBC    Component Value Date/Time   WBC 4.4 05/25/2021 0928   RBC 4.47 05/25/2021 0928   RBC 4.59 05/25/2021 0928   HGB 10.9 (L) 05/25/2021 0928   HGB 9.4 (L) 03/20/2021 1208   HCT 36.2 05/25/2021 0928   HCT 31.6 (L) 03/20/2021 1208   PLT 166 05/25/2021 0928   PLT 191 03/20/2021 1208   MCV 78.9 (L) 05/25/2021 0928   MCV 80 03/20/2021 1208   MCH 23.7 (L) 05/25/2021 0928   MCHC 30.1 05/25/2021 0928   RDW 17.6 (H) 05/25/2021 0928   RDW 15.3 03/20/2021 1208   LYMPHSABS 1.4 05/25/2021 0928   LYMPHSABS 1.8 03/20/2021 1208   MONOABS 0.3 05/25/2021 0928   EOSABS 0.0 05/25/2021 0928   EOSABS 0.0 03/20/2021 1208   BASOSABS 0.0 05/25/2021 0928   BASOSABS 0.0 03/20/2021 1208    CMP     Component Value Date/Time   NA 138 03/20/2021 1208   K 3.9 03/20/2021 1208   CL 99 03/20/2021 1208   CO2 23 03/20/2021 1208   GLUCOSE 91 03/20/2021 1208   GLUCOSE 90 02/28/2021 1124   BUN 9 03/20/2021 1208   CREATININE 0.98 03/20/2021 1208   CALCIUM 9.3 03/20/2021 1208   PROT 7.6 03/20/2021 1208   ALBUMIN 4.5 03/20/2021 1208   AST 37 03/20/2021 1208   ALT 41 (H) 03/20/2021 1208   ALKPHOS 150 (H) 03/20/2021 1208   BILITOT 0.4 03/20/2021 1208   GFRNONAA >60 02/28/2021 1124   GFRAA >60 04/09/2020 2044     Assessment and Plan: 1. Iron deficiency anemia due to chronic blood loss   2.  Menorrhagia with irregular cycle     Labs are reviewed and discussed with patient. Hemoglobin has improved to 10.9, still persistently iron deficiency.  Discussed about options of oral iron supplementation daily vs IV venofer treatments.  Patient will try to take iron supplementation daily.   Menorrhagia, recommend patient to see gyn for evaluation and management.   Follow Up Instructions: 6 months.    I discussed the assessment and treatment plan with the patient. The patient was provided an opportunity to ask questions and all were answered. The patient agreed with the plan and demonstrated an understanding of the  instructions.  The patient was advised to call back or seek an in-person evaluation if the symptoms worsen or if the condition fails to improve as anticipated.   Rickard Patience, MD 05/29/2021 10:37 PM

## 2021-05-30 ENCOUNTER — Inpatient Hospital Stay: Payer: 59

## 2021-05-30 ENCOUNTER — Ambulatory Visit: Payer: 59 | Admitting: Dietician

## 2021-07-02 ENCOUNTER — Encounter: Payer: Self-pay | Admitting: Dietician

## 2021-07-02 NOTE — Progress Notes (Signed)
Have not heard back from patient to reschedule her missed appointment from 05/30/21. Sent notification to referring provider.

## 2021-07-23 ENCOUNTER — Other Ambulatory Visit: Payer: Self-pay

## 2021-07-23 ENCOUNTER — Emergency Department
Admission: EM | Admit: 2021-07-23 | Discharge: 2021-07-23 | Disposition: A | Discharge status: Home / Self Care | Payer: 59 | Attending: Emergency Medicine | Admitting: Emergency Medicine

## 2021-07-23 DIAGNOSIS — R252 Cramp and spasm: Secondary | ICD-10-CM

## 2021-07-23 DIAGNOSIS — Z20822 Contact with and (suspected) exposure to covid-19: Secondary | ICD-10-CM | POA: Insufficient documentation

## 2021-07-23 DIAGNOSIS — R5383 Other fatigue: Secondary | ICD-10-CM | POA: Diagnosis not present

## 2021-07-23 DIAGNOSIS — M62838 Other muscle spasm: Secondary | ICD-10-CM | POA: Diagnosis present

## 2021-07-23 LAB — CBC WITH DIFFERENTIAL/PLATELET
Abs Immature Granulocytes: 0.01 10*3/uL (ref 0.00–0.07)
Basophils Absolute: 0 10*3/uL (ref 0.0–0.1)
Basophils Relative: 0 %
Eosinophils Absolute: 0 10*3/uL (ref 0.0–0.5)
Eosinophils Relative: 1 %
HCT: 33.1 % — ABNORMAL LOW (ref 36.0–46.0)
Hemoglobin: 9.9 g/dL — ABNORMAL LOW (ref 12.0–15.0)
Immature Granulocytes: 0 %
Lymphocytes Relative: 38 %
Lymphs Abs: 2.2 10*3/uL (ref 0.7–4.0)
MCH: 23.4 pg — ABNORMAL LOW (ref 26.0–34.0)
MCHC: 29.9 g/dL — ABNORMAL LOW (ref 30.0–36.0)
MCV: 78.3 fL — ABNORMAL LOW (ref 80.0–100.0)
Monocytes Absolute: 0.4 10*3/uL (ref 0.1–1.0)
Monocytes Relative: 6 %
Neutro Abs: 3.2 10*3/uL (ref 1.7–7.7)
Neutrophils Relative %: 55 %
Platelets: 185 10*3/uL (ref 150–400)
RBC: 4.23 MIL/uL (ref 3.87–5.11)
RDW: 16.9 % — ABNORMAL HIGH (ref 11.5–15.5)
WBC: 5.8 10*3/uL (ref 4.0–10.5)
nRBC: 0 % (ref 0.0–0.2)

## 2021-07-23 LAB — URINALYSIS, COMPLETE (UACMP) WITH MICROSCOPIC
Bilirubin Urine: NEGATIVE
Glucose, UA: NEGATIVE mg/dL
Hgb urine dipstick: NEGATIVE
Ketones, ur: 20 mg/dL — AB
Nitrite: NEGATIVE
Protein, ur: NEGATIVE mg/dL
Specific Gravity, Urine: 1.017 (ref 1.005–1.030)
pH: 6 (ref 5.0–8.0)

## 2021-07-23 LAB — COMPREHENSIVE METABOLIC PANEL
ALT: 24 U/L (ref 0–44)
AST: 25 U/L (ref 15–41)
Albumin: 3.9 g/dL (ref 3.5–5.0)
Alkaline Phosphatase: 90 U/L (ref 38–126)
Anion gap: 8 (ref 5–15)
BUN: 8 mg/dL (ref 6–20)
CO2: 19 mmol/L — ABNORMAL LOW (ref 22–32)
Calcium: 9 mg/dL (ref 8.9–10.3)
Chloride: 110 mmol/L (ref 98–111)
Creatinine, Ser: 0.8 mg/dL (ref 0.44–1.00)
GFR, Estimated: >60 mL/min (ref >60)
Glucose, Bld: 86 mg/dL (ref 70–99)
Potassium: 3.4 mmol/L — ABNORMAL LOW (ref 3.5–5.1)
Sodium: 137 mmol/L (ref 135–145)
Total Bilirubin: 0.6 mg/dL (ref 0.3–1.2)
Total Protein: 7.4 g/dL (ref 6.5–8.1)

## 2021-07-23 LAB — RESP PANEL BY RT-PCR (FLU A&B, COVID) ARPGX2
Influenza A by PCR: NEGATIVE
Influenza B by PCR: NEGATIVE
SARS Coronavirus 2 by RT PCR: NEGATIVE

## 2021-07-23 LAB — PREGNANCY, URINE: Preg Test, Ur: NEGATIVE

## 2021-07-23 LAB — HCG, QUANTITATIVE, PREGNANCY: hCG, Beta Chain, Quant, S: <1 m[IU]/mL (ref <5)

## 2021-07-23 NOTE — ED Provider Notes (Signed)
Emergency Medicine Provider Triage Evaluation Note  Anna Douglas , a 30 y.o. female  was evaluated in triage.  Pt complains of several days of feeling body aches, muscle cramps, fatigue.  Patient feels weak and tired.  No chest pain, shortness of breath or abdominal pain.  No urinary symptoms..  Review of Systems  Positive: Fatigue, muscle aches/cramps Negative: Fevers, nausea vomiting diarrhea or chest pain or shortness of breath  Physical Exam  BP 118/75 (BP Location: Right Arm)   Pulse 100   Temp 98.7 F (37.1 C) (Oral)   Resp 19   LMP 06/16/2021   SpO2 96%  Gen:   Awake, no distress   Resp:  Normal effort  MSK:   Moves extremities without difficulty  Other:    Medical Decision Making  Medically screening exam initiated at 4:33 PM.  Appropriate orders placed.  Anna Douglas was informed that the remainder of the evaluation will be completed by another provider, this initial triage assessment does not replace that evaluation, and the importance of remaining in the ED until their evaluation is complete.  Will order basic blood work, urinalysis and flu/COVID test   Anna Douglas 07/23/21 1637    Dionne Bucy, MD 07/23/21 [phone]

## 2021-07-23 NOTE — Discharge Instructions (Signed)
Your blood work was reassuring.  Your bicarbonate levels were slightly low which is likely from your acetazolamide.  Your urine sample did not show any evidence of infection and your pregnancy test was negative.  If you continue to have symptoms, please follow-up with your primary care provider and your neurologist.

## 2021-07-23 NOTE — ED Provider Notes (Signed)
[facility]  ____________________________________________   Event Date/Time   First MD Initiated Contact with Patient 07/23/21 1730     (approximate)  I have reviewed the triage vital signs and the nursing notes.   HISTORY  Chief Complaint No chief complaint on file.    HPI Anna Douglas is a 30 y.o. female with pmh of PCOS, intracranial hypertension, anxiety, depression who presents with multiple complaints.  She notes that for the last several days she has felt some general muscle spasms across her whole body as well as feeling generally fatigued.  She also feels like her bladder is full but denies any frank dysuria or hematuria urgency or frequency.  Also has been feeling constipated.  She denies fevers, chills, shortness of breath, abdominal pain, nausea vomiting.  Last menstrual period was about a month ago.  She did start taking Diamox several months ago for her pseudotumor cerebri.  She is primarily concerned about a potential electrolyte abnormality because she read that this can cause muscle spasms.        Past Medical History:  Diagnosis Date   Anxiety    Depression    Idiopathic intracranial hypertension    Polycystic ovarian disease     Patient Active Problem List   Diagnosis Date Noted   Vitamin D deficiency 04/28/2021   Iron deficiency anemia due to chronic blood loss 04/02/2021   Depression, major, single episode, moderate (HCC) 03/20/2021   Menorrhagia 03/20/2021   Tinea corporis 03/20/2021   Obesity 03/20/2021   Headache disorder 03/19/2021   PCOS (polycystic ovarian syndrome) 03/19/2021   Back pain without sciatica 03/19/2021   GAD (generalized anxiety disorder) 03/19/2021    Past Surgical History:  Procedure Laterality Date   NO PAST SURGERIES      Prior to Admission medications   Medication Sig Start Date End Date Taking? Authorizing Provider  acetaZOLAMIDE (DIAMOX) 125 MG tablet Take by mouth. 03/29/21    [provider]  Iron-Vitamin C (VITRON-C) 65-125 MG TABS Take 1 tablet by mouth daily with lunch. 04/02/21   Rickard Patience, MD    Allergies Patient has no known allergies.  Family History  Problem Relation Age of Onset   Hypertension Mother    Depression Father    Diabetes Father    Healthy Brother    Healthy Maternal Grandmother    Kidney disease Maternal Grandfather    Liver disease Maternal Grandfather    Diabetes Paternal Grandmother    Arthritis Paternal Grandmother    Healthy Daughter    Cancer Neg Hx     Social History Social History   Tobacco Use   Smoking status: Never   Smokeless tobacco: Never  Substance Use Topics   Alcohol use: Yes    Comment: occ   Drug use: Never    Review of Systems   Review of Systems  Constitutional:  Negative for chills and fever.  Respiratory:  Negative for shortness of breath.   Cardiovascular:  Negative for chest pain.  Gastrointestinal:  Positive for constipation. Negative for abdominal pain, nausea and vomiting.  Genitourinary:  Negative for dysuria.  Musculoskeletal:  Positive for myalgias.  Neurological:  Negative for weakness.  All other systems reviewed and are negative.  Physical Exam Updated Vital Signs BP 118/75 (BP Location: Right Arm)   Pulse 100   Temp 98.7 F (37.1 C) (Oral)   Resp 19   Ht 5\' 10"  (1.778 m)   Wt 119.7 kg   LMP 06/16/2021  SpO2 96%   BMI 37.86 kg/m   Physical Exam Vitals and nursing note reviewed.  Constitutional:      General: She is not in acute distress.    Appearance: Normal appearance.  HENT:     Head: Normocephalic and atraumatic.  Eyes:     General: No scleral icterus.    Conjunctiva/sclera: Conjunctivae normal.  Pulmonary:     Effort: Pulmonary effort is normal. No respiratory distress.     Breath sounds: No stridor. No wheezing.  Musculoskeletal:        General: No deformity or signs of injury.     Cervical back: Normal range of motion.  Skin:    General: Skin  is dry.     Coloration: Skin is not jaundiced or pale.  Neurological:     General: No focal deficit present.     Mental Status: She is alert and oriented to person, place, and time. Mental status is at baseline.  Psychiatric:        Mood and Affect: Mood normal.        Behavior: Behavior normal.     LABS (all labs ordered are listed, but only abnormal results are displayed)  Labs Reviewed  CBC WITH DIFFERENTIAL/PLATELET - Abnormal; Notable for the following components:      Result Value   Hemoglobin 9.9 (*)    HCT 33.1 (*)    MCV 78.3 (*)    MCH 23.4 (*)    MCHC 29.9 (*)    RDW 16.9 (*)    All other components within normal limits  COMPREHENSIVE METABOLIC PANEL - Abnormal; Notable for the following components:   Potassium 3.4 (*)    CO2 19 (*)    All other components within normal limits  URINALYSIS, COMPLETE (UACMP) WITH MICROSCOPIC - Abnormal; Notable for the following components:   Color, Urine YELLOW (*)    APPearance CLOUDY (*)    Ketones, ur 20 (*)    Leukocytes,Ua TRACE (*)    Bacteria, UA RARE (*)    All other components within normal limits  RESP PANEL BY RT-PCR (FLU A&B, COVID) ARPGX2  HCG, QUANTITATIVE, PREGNANCY  PREGNANCY, URINE   ____________________________________________  EKG  N/a ____________________________________________  RADIOLOGY Almeta Monas, personally viewed and evaluated these images (plain radiographs) as part of my medical decision making, as well as reviewing the written report by the radiologist.  ED MD interpretation:  n/a    ____________________________________________   PROCEDURES  Procedure(s) performed (including Critical Care):  Procedures   ____________________________________________   INITIAL IMPRESSION / ASSESSMENT AND PLAN / ED COURSE     30 year old female presents with several nonspecific's including muscle spasms constipation bladder fullness.  She is primarily concerned about potential  electrolyte abnormality in the setting of recently starting Diamox.  She is well-appearing with normal vital signs.  No localizing signs or symptoms of infection.  Her BMP is notable for a bicarb of 19 which I suspect is in the setting of her Diamox.  Sodium and potassium are in the normal range.  She is chronically anemic, hemoglobin 9.9 which is near her baseline.  UA without evidence of infection.  Pregnancy test is negative.  I advised that she follow-up with her primary care provider for ongoing work-up if she continues to feel unwell which may include evaluation for other endocrine etiologies including TSH.  She is otherwise stable for discharge.  Clinical Course as of 07/23/21 2150  [facility] Jul 23, 2021  1805 Preg Test, Ur:  NEGATIVE [KM]    Clinical Course User Index [KM] Rada Hay, MD     ____________________________________________   FINAL CLINICAL IMPRESSION(S) / ED DIAGNOSES  Final diagnoses:  Muscle cramps     ED Discharge Orders     None        Note:  This document was prepared using Dragon voice recognition software and may include unintentional dictation errors.    Rada Hay, MD 07/23/21 2150

## 2021-07-23 NOTE — ED Triage Notes (Signed)
Pt presents to ED with multiple complaints. Pt states she has been getting spasms multiple places. Pt is concerned about electrolytes due to taking diuretic. Pt is A&Ox4. NAD noted. VSS.

## 2021-08-28 ENCOUNTER — Inpatient Hospital Stay: Payer: 59 | Attending: Oncology

## 2021-08-30 ENCOUNTER — Inpatient Hospital Stay: Payer: 59 | Admitting: Oncology

## 2021-08-30 ENCOUNTER — Encounter: Payer: Self-pay | Admitting: Oncology

## 2021-08-30 ENCOUNTER — Telehealth: Payer: Self-pay

## 2021-08-30 NOTE — Telephone Encounter (Signed)
Called to work up Anna Douglas's for [employer] visit. No answer, LMTCB.

## 2021-08-30 NOTE — Telephone Encounter (Signed)
Pt will r/s due to no show to labs. Labs then Mychart 1-2 days after labs .

## 2021-09-01 ENCOUNTER — Encounter: Payer: Self-pay | Admitting: Oncology

## 2021-09-01 NOTE — Progress Notes (Signed)
No show

## 2021-09-13 ENCOUNTER — Encounter: Payer: Self-pay | Admitting: Oncology

## 2021-09-27 ENCOUNTER — Other Ambulatory Visit: Payer: Self-pay | Admitting: *Deleted

## 2021-09-27 DIAGNOSIS — D500 Iron deficiency anemia secondary to blood loss (chronic): Secondary | ICD-10-CM

## 2021-10-02 ENCOUNTER — Inpatient Hospital Stay: Payer: Self-pay | Attending: Nurse Practitioner

## 2021-10-02 DIAGNOSIS — D500 Iron deficiency anemia secondary to blood loss (chronic): Secondary | ICD-10-CM | POA: Insufficient documentation

## 2021-10-04 ENCOUNTER — Telehealth: Payer: Self-pay

## 2021-10-04 ENCOUNTER — Other Ambulatory Visit: Payer: Self-pay

## 2021-10-04 ENCOUNTER — Inpatient Hospital Stay: Payer: Self-pay | Admitting: Nurse Practitioner

## 2021-10-04 NOTE — Telephone Encounter (Signed)
Called patient to assess information for video visit, patient states she needs to reschedule. Scheduler and NP aware.

## 2021-10-08 ENCOUNTER — Inpatient Hospital Stay: Payer: Self-pay

## 2021-10-08 ENCOUNTER — Other Ambulatory Visit: Payer: Self-pay

## 2021-10-08 DIAGNOSIS — D500 Iron deficiency anemia secondary to blood loss (chronic): Secondary | ICD-10-CM

## 2021-10-08 LAB — CBC WITH DIFFERENTIAL/PLATELET
Abs Immature Granulocytes: 0.02 10*3/uL (ref 0.00–0.07)
Basophils Absolute: 0 10*3/uL (ref 0.0–0.1)
Basophils Relative: 0 %
Eosinophils Absolute: 0 10*3/uL (ref 0.0–0.5)
Eosinophils Relative: 1 %
HCT: 34.6 % — ABNORMAL LOW (ref 36.0–46.0)
Hemoglobin: 10.4 g/dL — ABNORMAL LOW (ref 12.0–15.0)
Immature Granulocytes: 0 %
Lymphocytes Relative: 28 %
Lymphs Abs: 1.5 10*3/uL (ref 0.7–4.0)
MCH: 22 pg — ABNORMAL LOW (ref 26.0–34.0)
MCHC: 30.1 g/dL (ref 30.0–36.0)
MCV: 73.2 fL — ABNORMAL LOW (ref 80.0–100.0)
Monocytes Absolute: 0.3 10*3/uL (ref 0.1–1.0)
Monocytes Relative: 5 %
Neutro Abs: 3.6 10*3/uL (ref 1.7–7.7)
Neutrophils Relative %: 66 %
Platelets: 179 10*3/uL (ref 150–400)
RBC: 4.73 MIL/uL (ref 3.87–5.11)
RDW: 19.3 % — ABNORMAL HIGH (ref 11.5–15.5)
WBC: 5.4 10*3/uL (ref 4.0–10.5)
nRBC: 0 % (ref 0.0–0.2)

## 2021-10-08 LAB — IRON AND TIBC
Iron: 27 ug/dL — ABNORMAL LOW (ref 28–170)
Saturation Ratios: 6 % — ABNORMAL LOW (ref 10.4–31.8)
TIBC: 459 ug/dL — ABNORMAL HIGH (ref 250–450)
UIBC: 432 ug/dL

## 2021-10-08 LAB — FERRITIN: Ferritin: 5 ng/mL — ABNORMAL LOW (ref 11–307)

## 2021-10-09 ENCOUNTER — Encounter: Payer: Self-pay | Admitting: Nurse Practitioner

## 2021-10-09 ENCOUNTER — Inpatient Hospital Stay (HOSPITAL_BASED_OUTPATIENT_CLINIC_OR_DEPARTMENT_OTHER): Payer: Self-pay | Admitting: Nurse Practitioner

## 2021-10-09 DIAGNOSIS — D500 Iron deficiency anemia secondary to blood loss (chronic): Secondary | ICD-10-CM

## 2021-10-09 NOTE — Progress Notes (Signed)
HEMATOLOGY-ONCOLOGY TeleHEALTH VISIT PROGRESS NOTE  I connected with Anna Douglas on 10/09/21  at  1:15 PM EST by video enabled telemedicine visit and verified that I am speaking with the correct person using two identifiers. I discussed the limitations, risks, security and privacy concerns of performing an evaluation and management service by telemedicine and the availability of in-person appointments. The patient expressed understanding and agreed to proceed.   Other persons participating in the visit and their role in the encounter: None  Patient's location: Home  Provider's location: office Chief Complaint: follow up for IDA   INTERVAL HISTORY Anna Douglas is a 31 y.o. female who has above history reviewed by me today presents for follow up visit for management of IDA.  She has more normal menstrual periods, occurring monthly, lasting 4-5 days, not described as heavy. She is having headaches. Denies fatigue. Denies sob or chest pain. No leg cramps. Light ice eating. Not taking oral iron d/t constipation.    Review of Systems  Constitutional:  Negative for appetite change, chills, fatigue and fever.  HENT:   Negative for hearing loss and voice change.   Eyes:  Negative for eye problems.  Respiratory:  Negative for chest tightness and cough.   Cardiovascular:  Negative for chest pain.  Gastrointestinal:  Negative for abdominal distention, abdominal pain and blood in stool.  Endocrine: Negative for hot flashes.  Genitourinary:  Positive for vaginal bleeding. Negative for difficulty urinating and frequency.   Musculoskeletal:  Negative for arthralgias.  Skin:  Negative for itching and rash.  Neurological:  Negative for extremity weakness.  Hematological:  Negative for adenopathy.  Psychiatric/Behavioral:  Negative for confusion.     Past Medical History:  Diagnosis Date   Anxiety    Depression    Idiopathic intracranial hypertension    Polycystic ovarian disease     Past Surgical History:  Procedure Laterality Date   NO PAST SURGERIES      Family History  Problem Relation Age of Onset   Hypertension Mother    Depression Father    Diabetes Father    Healthy Brother    Healthy Maternal Grandmother    Kidney disease Maternal Grandfather    Liver disease Maternal Grandfather    Diabetes Paternal Grandmother    Arthritis Paternal Grandmother    Healthy Daughter    Cancer Neg Hx     Social History   Socioeconomic History   Marital status: Single    Spouse name: Not on file   Number of children: Not on file   Years of education: Not on file   Highest education level: Not on file  Occupational History   Not on file  Tobacco Use   Smoking status: Never   Smokeless tobacco: Never  Substance and Sexual Activity   Alcohol use: Yes    Comment: occ   Drug use: Never   Sexual activity: Not Currently  Other Topics Concern   Not on file  Social History Narrative   Not on file   Social Determinants of Health   Financial Resource Strain: Low Risk    Difficulty of Paying Living Expenses: Not very hard  Food Insecurity: No Food Insecurity   Worried About Running Out of Food in the Last Year: Never true   Ran Out of Food in the Last Year: Never true  Transportation Needs: No Transportation Needs   Lack of Transportation (Medical): No   Lack of Transportation (Non-Medical): No  Physical Activity: Inactive  Days of Exercise per Week: 0 days   Minutes of Exercise per Session: 0 min  Stress: No Stress Concern Present   Feeling of Stress : Only a little  Social Connections: Socially Isolated   Frequency of Communication with Friends and Family: Once a week   Frequency of Social Gatherings with Friends and Family: Once a week   Attends Religious Services: Never   Database administrator or Organizations: No   Attends Engineer, structural: Never   Marital Status: Never married  Catering manager Violence: Not At Risk   Fear of  Current or Ex-Partner: No   Emotionally Abused: No   Physically Abused: No   Sexually Abused: No    Current Outpatient Medications on File Prior to Visit  Medication Sig Dispense Refill   acetaZOLAMIDE (DIAMOX) 125 MG tablet Take by mouth.     Iron-Vitamin C (VITRON-C) 65-125 MG TABS Take 1 tablet by mouth daily with lunch. (Patient not taking: Reported on 10/09/2021) 30 tablet 3   No current facility-administered medications on file prior to visit.    No Known Allergies   Observations/Objective: Today's Vitals   10/09/21 1258  PainSc: 0-No pain   There is no height or weight on file to calculate BMI.  Physical Exam Constitutional:      Appearance: She is not ill-appearing.  Neurological:     Mental Status: She is alert and oriented to person, place, and time.  Psychiatric:        Mood and Affect: Mood normal.        Behavior: Behavior normal.    CBC    Component Value Date/Time   WBC 5.4 10/08/2021 0801   RBC 4.73 10/08/2021 0801   HGB 10.4 (L) 10/08/2021 0801   HGB 9.4 (L) 03/20/2021 1208   HCT 34.6 (L) 10/08/2021 0801   HCT 31.6 (L) 03/20/2021 1208   PLT 179 10/08/2021 0801   PLT 191 03/20/2021 1208   MCV 73.2 (L) 10/08/2021 0801   MCV 80 03/20/2021 1208   MCH 22.0 (L) 10/08/2021 0801   MCHC 30.1 10/08/2021 0801   RDW 19.3 (H) 10/08/2021 0801   RDW 15.3 03/20/2021 1208   LYMPHSABS 1.5 10/08/2021 0801   LYMPHSABS 1.8 03/20/2021 1208   MONOABS 0.3 10/08/2021 0801   EOSABS 0.0 10/08/2021 0801   EOSABS 0.0 03/20/2021 1208   BASOSABS 0.0 10/08/2021 0801   BASOSABS 0.0 03/20/2021 1208    CMP     Component Value Date/Time   NA 137 07/23/2021 1629   NA 138 03/20/2021 1208   K 3.4 (L) 07/23/2021 1629   CL 110 07/23/2021 1629   CO2 19 (L) 07/23/2021 1629   GLUCOSE 86 07/23/2021 1629   BUN 8 07/23/2021 1629   BUN 9 03/20/2021 1208   CREATININE 0.80 07/23/2021 1629   CALCIUM 9.0 07/23/2021 1629   PROT 7.4 07/23/2021 1629   PROT 7.6 03/20/2021 1208   ALBUMIN  3.9 07/23/2021 1629   ALBUMIN 4.5 03/20/2021 1208   AST 25 07/23/2021 1629   ALT 24 07/23/2021 1629   ALKPHOS 90 07/23/2021 1629   BILITOT 0.6 07/23/2021 1629   BILITOT 0.4 03/20/2021 1208   GFRNONAA >60 07/23/2021 1629   GFRAA >60 04/09/2020 2044    Iron/TIBC/Ferritin/ %Sat    Component Value Date/Time   IRON 27 (L) 10/08/2021 0801   IRON 203 (H) 03/20/2021 1208   TIBC 459 (H) 10/08/2021 0801   TIBC 454 (H) 03/20/2021 1208   FERRITIN 5 (L)  10/08/2021 0801   FERRITIN 10 (L) 03/20/2021 1208   IRONPCTSAT 6 (L) 10/08/2021 0801   IRONPCTSAT 45 03/20/2021 1208    Assessment and Plan: No diagnosis found.   Iron Deficiency Anemia- secondary to chronic blood loss (see below). Hemoglobin 10.4 with associated microcytosis. Ferritin 5, iron saturation 6% with elevated TIBC. Findings consistent with iron deficiency anemia. Not taking oral iron d/t constipation. She is reluctant to try IV iron d/t concern of possible side effects. She would like to try oral iron again. Advised her to take oral iron with vitamin c/glass of orange juice. Advised that taking iron supplements daily in divided doses increased serum hepcidin which reduces iron absorption. Taking iron supplements daily or on alternating days in single doses optimizes iron absorption. She can take stool softener to minimize constipation but is concerned of taking additional medications. Reviewed that IV iron can help replenish her iron stores faster than oral iron.    - Stoffel NU, [place] CI, [place] G, Zeder C, [place], [place]ink[place] DW, [place], [place] MB. Iron absorption from oral iron supplements given on consecutive versus alternate days and as single morning doses versus twice-daily split dosing in iron-depleted women: two open-label, randomised controlled trials. Lancet Haematol. 2017 Nov;4(11):e524-e533. doi: 10.1016/S2352-3026(17)30182-5. Epub 2017 Oct 9. PMID: [id number].   Menorrhagia- symptoms resolved.    Follow Up Instructions: 8 weeks- lab Day later see Dr Cathie Hoops for virtual visit Should she decide to take IV iron instead she will call the clinic sooner.   I discussed the assessment and treatment plan with the patient. The patient was provided an opportunity to ask questions and all were answered. The patient agreed with the plan and demonstrated an understanding of the instructions.   The patient was advised to call back or seek an in-person evaluation if the symptoms worsen or if the condition fails to improve as anticipated.   Alinda Dooms, NP 10/09/2021

## 2021-11-23 DIAGNOSIS — H4710 Unspecified papilledema: Secondary | ICD-10-CM | POA: Diagnosis not present

## 2021-11-27 ENCOUNTER — Other Ambulatory Visit: Payer: Self-pay | Admitting: *Deleted

## 2021-11-27 DIAGNOSIS — D500 Iron deficiency anemia secondary to blood loss (chronic): Secondary | ICD-10-CM

## 2021-12-04 ENCOUNTER — Other Ambulatory Visit: Payer: Self-pay

## 2021-12-04 ENCOUNTER — Inpatient Hospital Stay: Payer: Self-pay | Attending: Oncology

## 2021-12-05 ENCOUNTER — Inpatient Hospital Stay: Payer: Self-pay | Admitting: Oncology

## 2021-12-05 ENCOUNTER — Other Ambulatory Visit: Payer: Self-pay

## 2021-12-17 ENCOUNTER — Inpatient Hospital Stay: Payer: Self-pay | Attending: Nurse Practitioner

## 2021-12-17 DIAGNOSIS — N921 Excessive and frequent menstruation with irregular cycle: Secondary | ICD-10-CM | POA: Insufficient documentation

## 2021-12-17 DIAGNOSIS — D500 Iron deficiency anemia secondary to blood loss (chronic): Secondary | ICD-10-CM | POA: Insufficient documentation

## 2021-12-19 ENCOUNTER — Telehealth: Payer: Self-pay

## 2021-12-19 ENCOUNTER — Inpatient Hospital Stay: Payer: Self-pay | Admitting: Oncology

## 2021-12-19 NOTE — Telephone Encounter (Signed)
Patient called/ pre- screened for virtual appoinment today. Patient wants to reschedule, she said she didn't remember appointment and she didn't get labs. I told her when the scheduler gets time you will call her to reschedule. Scheduler/ MD informed ?

## 2021-12-24 ENCOUNTER — Inpatient Hospital Stay: Payer: Self-pay

## 2021-12-25 ENCOUNTER — Encounter: Payer: Self-pay | Admitting: Oncology

## 2021-12-25 ENCOUNTER — Inpatient Hospital Stay: Payer: Self-pay | Admitting: Oncology

## 2022-01-03 ENCOUNTER — Inpatient Hospital Stay: Payer: Self-pay

## 2022-01-03 DIAGNOSIS — D500 Iron deficiency anemia secondary to blood loss (chronic): Secondary | ICD-10-CM

## 2022-01-03 LAB — CBC WITH DIFFERENTIAL/PLATELET
Abs Immature Granulocytes: 0.01 10*3/uL (ref 0.00–0.07)
Basophils Absolute: 0 10*3/uL (ref 0.0–0.1)
Basophils Relative: 0 %
Eosinophils Absolute: 0 10*3/uL (ref 0.0–0.5)
Eosinophils Relative: 0 %
HCT: 39.3 % (ref 36.0–46.0)
Hemoglobin: 12.3 g/dL (ref 12.0–15.0)
Immature Granulocytes: 0 %
Lymphocytes Relative: 28 %
Lymphs Abs: 1.6 10*3/uL (ref 0.7–4.0)
MCH: 25.7 pg — ABNORMAL LOW (ref 26.0–34.0)
MCHC: 31.3 g/dL (ref 30.0–36.0)
MCV: 82 fL (ref 80.0–100.0)
Monocytes Absolute: 0.2 10*3/uL (ref 0.1–1.0)
Monocytes Relative: 4 %
Neutro Abs: 3.7 10*3/uL (ref 1.7–7.7)
Neutrophils Relative %: 68 %
Platelets: 144 10*3/uL — ABNORMAL LOW (ref 150–400)
RBC: 4.79 MIL/uL (ref 3.87–5.11)
RDW: 16.7 % — ABNORMAL HIGH (ref 11.5–15.5)
WBC: 5.5 10*3/uL (ref 4.0–10.5)
nRBC: 0 % (ref 0.0–0.2)

## 2022-01-03 LAB — IRON AND TIBC
Iron: 134 ug/dL (ref 28–170)
Saturation Ratios: 31 % (ref 10.4–31.8)
TIBC: 431 ug/dL (ref 250–450)
UIBC: 297 ug/dL

## 2022-01-03 LAB — FERRITIN: Ferritin: 9 ng/mL — ABNORMAL LOW (ref 11–307)

## 2022-01-10 ENCOUNTER — Telehealth: Payer: Self-pay

## 2022-01-10 NOTE — Telephone Encounter (Signed)
patient had labs on 4/20, but Mychart visit has not been r/s. please r/s and inform pt of new mychart appt.  ?

## 2022-01-10 NOTE — Telephone Encounter (Signed)
-----   Message from Alinda Dooms, NP sent at 01/10/2022 12:44 PM EDT ----- ?Patient had labs checked but does not have appointment to follow up on those labs. Could you reach out to her and see if she wants to get scheduled? Thanks! ?Lauren ? ?----- Message ----- ?From: Interface, [facility] In [place] ?Sent: 01/03/2022   8:35 AM EDT ?To: Alinda Dooms, NP ? ? ?

## 2022-01-22 ENCOUNTER — Inpatient Hospital Stay: Payer: Self-pay | Attending: Oncology | Admitting: Oncology

## 2022-01-22 ENCOUNTER — Encounter: Payer: Self-pay | Admitting: Oncology

## 2022-01-22 ENCOUNTER — Telehealth: Payer: Self-pay | Admitting: Oncology

## 2022-01-22 DIAGNOSIS — D500 Iron deficiency anemia secondary to blood loss (chronic): Secondary | ICD-10-CM

## 2022-01-22 DIAGNOSIS — N921 Excessive and frequent menstruation with irregular cycle: Secondary | ICD-10-CM

## 2022-01-22 NOTE — Progress Notes (Addendum)
HEMATOLOGY-ONCOLOGY TeleHEALTH VISIT PROGRESS NOTE  ?I connected with Anna Douglas on 01/22/22  at  1:00 PM EDT by video enabled telemedicine visit and verified that I am speaking with the correct person using two identifiers. ?I discussed the limitations, risks, security and privacy concerns of performing an evaluation and management service by telemedicine and the availability of in-person appointments. The patient expressed understanding and agreed to proceed.  ? ?Other persons participating in the visit and their role in the encounter: None ? ?Patient's location: Home ?Provider's location: office ?Chief Complaint: follow up for IDA ? ? ?INTERVAL HISTORY ?Anna Douglas is a 31 y.o. female who has above history reviewed by me today presents for follow up visit for management of IDA.   ?Patient reports feeling well today.  She takes oral iron supplementation. ?Mr.  Has been less heavy. ? ? ?Review of Systems  ?Constitutional:  Negative for appetite change, chills, fatigue and fever.  ?HENT:   Negative for hearing loss and voice change.   ?Eyes:  Negative for eye problems.  ?Respiratory:  Negative for chest tightness and cough.   ?Cardiovascular:  Negative for chest pain.  ?Gastrointestinal:  Negative for abdominal distention, abdominal pain and blood in stool.  ?Endocrine: Negative for hot flashes.  ?Genitourinary:  Negative for difficulty urinating and frequency.   ?Musculoskeletal:  Negative for arthralgias.  ?Skin:  Negative for itching and rash.  ?Neurological:  Negative for extremity weakness.  ?Hematological:  Negative for adenopathy.  ?Psychiatric/Behavioral:  Negative for confusion.    ? ?Past Medical History:  ?Diagnosis Date  ? Anxiety   ? Depression   ? Idiopathic intracranial hypertension   ? Polycystic ovarian disease   ? ?Past Surgical History:  ?Procedure Laterality Date  ? NO PAST SURGERIES    ?  ?Family History  ?Problem Relation Age of Onset  ? Hypertension Mother   ? Depression  Father   ? Diabetes Father   ? Healthy Brother   ? Healthy Maternal Grandmother   ? Kidney disease Maternal Grandfather   ? Liver disease Maternal Grandfather   ? Diabetes Paternal Grandmother   ? Arthritis Paternal Grandmother   ? Healthy Daughter   ? Cancer Neg Hx   ?  ?Social History  ? ?Socioeconomic History  ? Marital status: Single  ?  Spouse name: Not on file  ? Number of children: Not on file  ? Years of education: Not on file  ? Highest education level: Not on file  ?Occupational History  ? Not on file  ?Tobacco Use  ? Smoking status: Never  ? Smokeless tobacco: Never  ?Substance and Sexual Activity  ? Alcohol use: Yes  ?  Comment: occ  ? Drug use: Never  ? Sexual activity: Not Currently  ?Other Topics Concern  ? Not on file  ?Social History Narrative  ? Not on file  ? ?Social Determinants of Health  ? ?Financial Resource Strain: Low Risk   ? Difficulty of Paying Living Expenses: Not very hard  ?Food Insecurity: No Food Insecurity  ? Worried About Programme researcher, broadcasting/film/video in the Last Year: Never true  ? Ran Out of Food in the Last Year: Never true  ?Transportation Needs: No Transportation Needs  ? Lack of Transportation (Medical): No  ? Lack of Transportation (Non-Medical): No  ?Physical Activity: Inactive  ? Days of Exercise per Week: 0 days  ? Minutes of Exercise per Session: 0 min  ?Stress: No Stress Concern Present  ? Feeling of  Stress : Only a little  ?Social Connections: Socially Isolated  ? Frequency of Communication with Friends and Family: Once a week  ? Frequency of Social Gatherings with Friends and Family: Once a week  ? Attends Religious Services: Never  ? Active Member of Clubs or Organizations: No  ? Attends BankerClub or Organization Meetings: Never  ? Marital Status: Never married  ?Intimate Partner Violence: Not At Risk  ? Fear of Current or Ex-Partner: No  ? Emotionally Abused: No  ? Physically Abused: No  ? Sexually Abused: No  ?  ?Current Outpatient Medications on File Prior to Visit  ?Medication  Sig Dispense Refill  ? acetaZOLAMIDE (DIAMOX) 125 MG tablet Take by mouth.    ? Iron-Vitamin C (VITRON-C) 65-125 MG TABS Take 1 tablet by mouth daily with lunch. 30 tablet 3  ? ?No current facility-administered medications on file prior to visit.  ?  ?No Known Allergies  ? ?Observations/Objective: ?There were no vitals filed for this visit. ? ?There is no height or weight on file to calculate BMI.  ?Physical Exam ?Constitutional:   ?   Appearance: She is not ill-appearing.  ?Neurological:  ?   Mental Status: She is alert and oriented to person, place, and time.  ? ? ?CBC ?   ?Component Value Date/Time  ? WBC 5.5 01/03/2022 0825  ? RBC 4.79 01/03/2022 0825  ? HGB 12.3 01/03/2022 0825  ? HGB 9.4 (L) 03/20/2021 1208  ? HCT 39.3 01/03/2022 0825  ? HCT 31.6 (L) 03/20/2021 1208  ? PLT 144 (L) 01/03/2022 0825  ? PLT 191 03/20/2021 1208  ? MCV 82.0 01/03/2022 0825  ? MCV 80 03/20/2021 1208  ? MCH 25.7 (L) 01/03/2022 0825  ? MCHC 31.3 01/03/2022 0825  ? RDW 16.7 (H) 01/03/2022 0825  ? RDW 15.3 03/20/2021 1208  ? LYMPHSABS 1.6 01/03/2022 0825  ? LYMPHSABS 1.8 03/20/2021 1208  ? MONOABS 0.2 01/03/2022 0825  ? EOSABS 0.0 01/03/2022 0825  ? EOSABS 0.0 03/20/2021 1208  ? BASOSABS 0.0 01/03/2022 0825  ? BASOSABS 0.0 03/20/2021 1208  ?  ?CMP  ?   ?Component Value Date/Time  ? NA 137 07/23/2021 1629  ? NA 138 03/20/2021 1208  ? K 3.4 (L) 07/23/2021 1629  ? CL 110 07/23/2021 1629  ? CO2 19 (L) 07/23/2021 1629  ? GLUCOSE 86 07/23/2021 1629  ? BUN 8 07/23/2021 1629  ? BUN 9 03/20/2021 1208  ? CREATININE 0.80 07/23/2021 1629  ? CALCIUM 9.0 07/23/2021 1629  ? PROT 7.4 07/23/2021 1629  ? PROT 7.6 03/20/2021 1208  ? ALBUMIN 3.9 07/23/2021 1629  ? ALBUMIN 4.5 03/20/2021 1208  ? AST 25 07/23/2021 1629  ? ALT 24 07/23/2021 1629  ? ALKPHOS 90 07/23/2021 1629  ? BILITOT 0.6 07/23/2021 1629  ? BILITOT 0.4 03/20/2021 1208  ? GFRNONAA >60 07/23/2021 1629  ? GFRAA >60 04/09/2020 2044  ?  ?Iron/TIBC/Ferritin/ %Sat ?   ?Component Value Date/Time  ?  IRON 134 01/03/2022 0825  ? IRON 203 (H) 03/20/2021 1208  ? TIBC 431 01/03/2022 0825  ? TIBC 454 (H) 03/20/2021 1208  ? FERRITIN 9 (L) 01/03/2022 0825  ? FERRITIN 10 (L) 03/20/2021 1208  ? IRONPCTSAT 31 01/03/2022 0825  ? IRONPCTSAT 45 03/20/2021 1208  ? ? ?Assessment and Plan: ?1. Iron deficiency anemia due to chronic blood loss   ?2. Menorrhagia with irregular cycle   ? ?  ?Iron Deficiency Anemia- secondary to chronic blood loss (see below).  ?Labs reviewed and discussed  with patient ?Hemoglobin has normalized.  Still has mild iron deficiency with a decreased iron saturation of 9%. ?Recommend patient to continue iron supplementation. ?Follow-up in 6 months.  Patient agrees with the plan. ? ? ?Follow Up Instructions: ?Labs in 6 months, iron labs. 2-3 days prior to MD in person. ? ?I discussed the assessment and treatment plan with the patient. The patient was provided an opportunity to ask questions and all were answered. The patient agreed with the plan and demonstrated an understanding of the instructions.  ? ?The patient was advised to call back or seek an in-person evaluation if the symptoms worsen or if the condition fails to improve as anticipated.  ? ?We spent sufficient time to discuss many aspect of care, questions were answered to patient's satisfaction. ?A total of 15 minutes was spent on this visit.  ? ?Rickard Patience, MD 01/22/2022  ? ?

## 2022-01-22 NOTE — Progress Notes (Signed)
Contacted pt for Mychart visit. Meds reviewed. No new problems or concerns.  ?

## 2022-02-20 DIAGNOSIS — G8929 Other chronic pain: Secondary | ICD-10-CM | POA: Diagnosis not present

## 2022-02-20 DIAGNOSIS — G932 Benign intracranial hypertension: Secondary | ICD-10-CM | POA: Diagnosis not present

## 2022-02-20 DIAGNOSIS — M545 Low back pain, unspecified: Secondary | ICD-10-CM | POA: Diagnosis not present

## 2022-02-20 DIAGNOSIS — R202 Paresthesia of skin: Secondary | ICD-10-CM | POA: Diagnosis not present

## 2022-02-20 DIAGNOSIS — R519 Headache, unspecified: Secondary | ICD-10-CM | POA: Diagnosis not present

## 2022-02-20 DIAGNOSIS — H539 Unspecified visual disturbance: Secondary | ICD-10-CM | POA: Diagnosis not present

## 2022-02-20 DIAGNOSIS — R200 Anesthesia of skin: Secondary | ICD-10-CM | POA: Diagnosis not present

## 2022-06-14 DIAGNOSIS — G932 Benign intracranial hypertension: Secondary | ICD-10-CM | POA: Diagnosis not present

## 2022-07-18 ENCOUNTER — Telehealth: Payer: Self-pay | Admitting: Oncology

## 2022-07-18 NOTE — Telephone Encounter (Signed)
LVM for pt to give [place] a call back if she would like to move appt to earlier time

## 2022-07-22 ENCOUNTER — Inpatient Hospital Stay: Payer: Self-pay | Attending: Oncology

## 2022-07-25 ENCOUNTER — Inpatient Hospital Stay: Payer: Self-pay | Admitting: Oncology
